# Patient Record
Sex: Female | Born: 1980 | Race: Black or African American | Hispanic: No | Marital: Single | Smoking: Never smoker
Health system: Southern US, Community
[De-identification: ages and names within clinical notes are randomized; demographics above are authoritative.]

## PROBLEM LIST (undated history)

## (undated) DIAGNOSIS — K219 Gastro-esophageal reflux disease without esophagitis: Secondary | ICD-10-CM

## (undated) DIAGNOSIS — K3184 Gastroparesis: Secondary | ICD-10-CM

## (undated) HISTORY — PX: FOOT SURGERY: SHX648

---

## 2009-11-29 ENCOUNTER — Emergency Department (HOSPITAL_BASED_OUTPATIENT_CLINIC_OR_DEPARTMENT_OTHER): Admission: EM | Admit: 2009-11-29 | Discharge: 2009-11-29 | Payer: Self-pay | Admitting: Emergency Medicine

## 2009-11-29 ENCOUNTER — Ambulatory Visit: Payer: Self-pay | Admitting: Diagnostic Radiology

## 2009-12-30 ENCOUNTER — Ambulatory Visit: Payer: Self-pay | Admitting: Diagnostic Radiology

## 2009-12-30 ENCOUNTER — Emergency Department (HOSPITAL_BASED_OUTPATIENT_CLINIC_OR_DEPARTMENT_OTHER): Admission: EM | Admit: 2009-12-30 | Discharge: 2009-12-30 | Payer: Self-pay | Admitting: Emergency Medicine

## 2010-03-26 ENCOUNTER — Emergency Department (HOSPITAL_BASED_OUTPATIENT_CLINIC_OR_DEPARTMENT_OTHER): Admission: EM | Admit: 2010-03-26 | Discharge: 2010-03-26 | Payer: Self-pay | Admitting: Emergency Medicine

## 2011-07-26 IMAGING — CR DG CHEST 2V
2 series · 2 of 2 positions shown · non-contrast
Comparison: None.

CLINICAL DATA: Cough.  Vomiting blood for 1 week.

CHEST - 2 VIEW

[w chest pa]
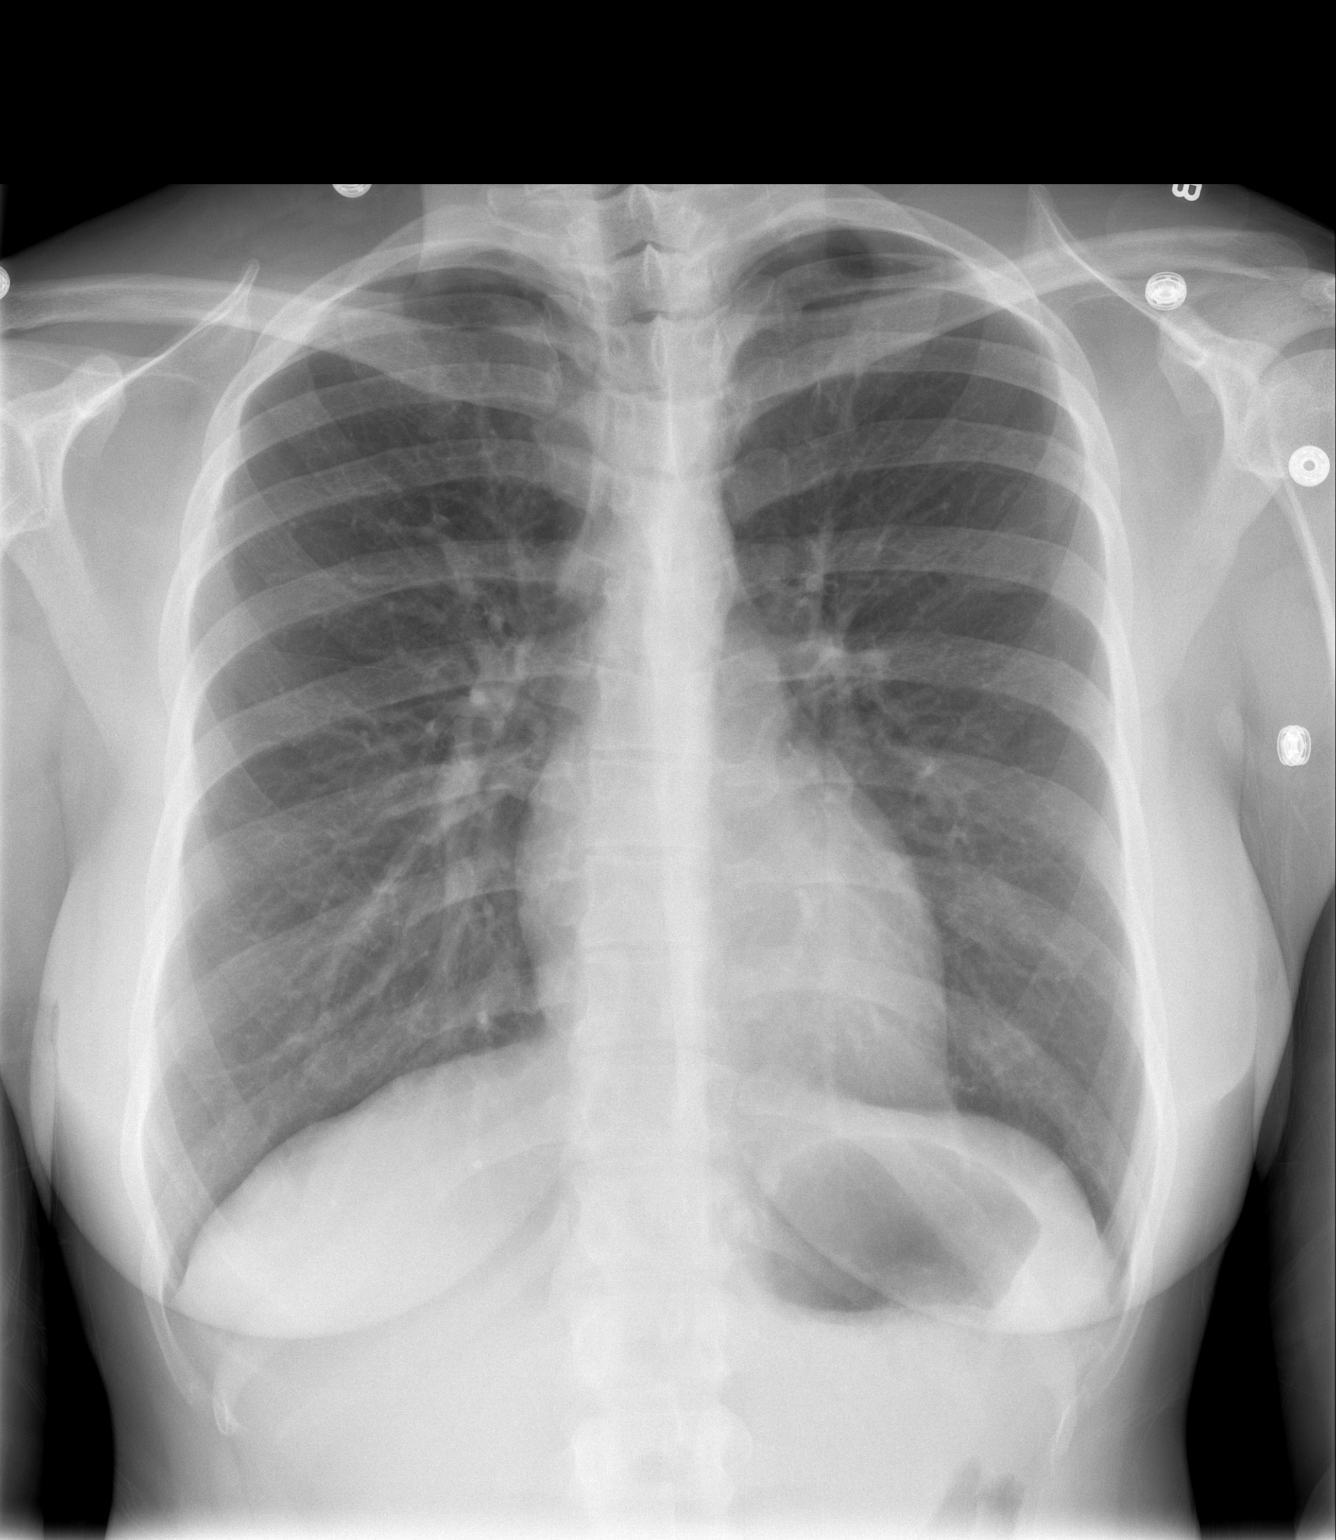

[w chest lat]
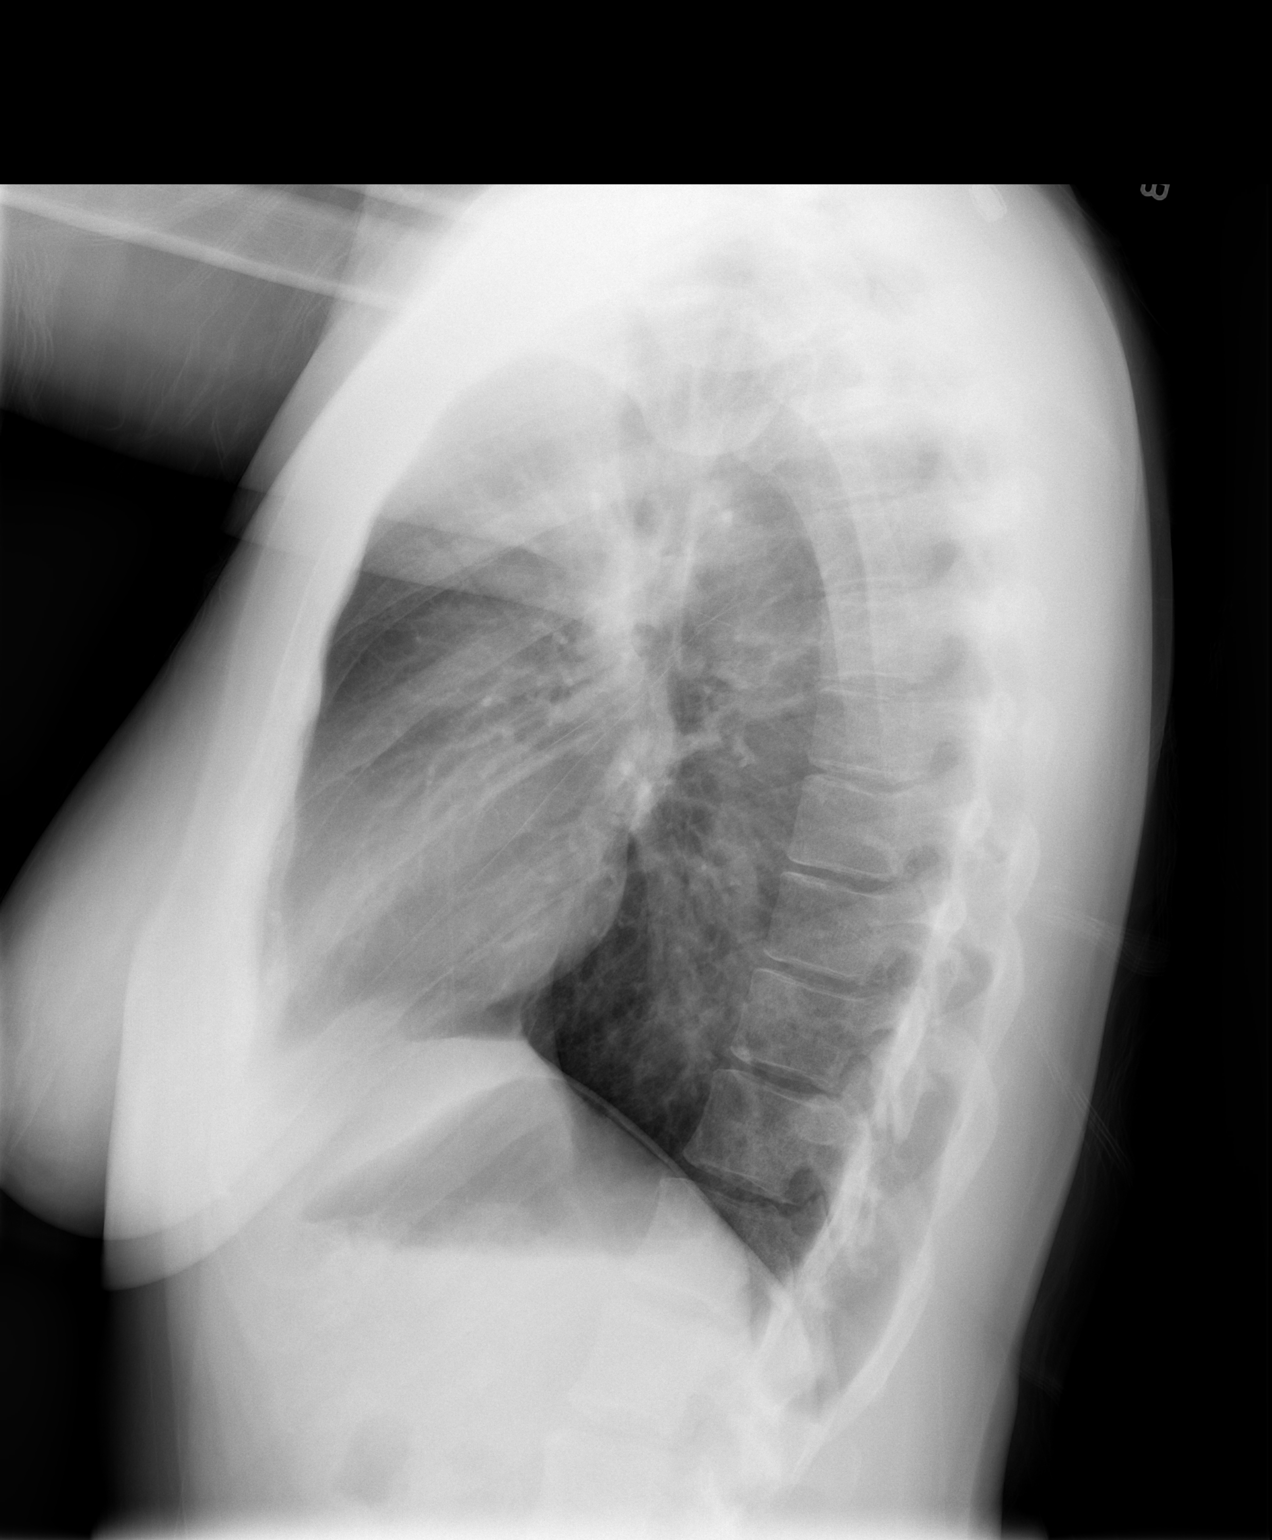

[2 of 2 positions shown; findings below may reference images not displayed]

FINDINGS: Small focus patchy airspace disease is present lateral to
the left heart border.  On the lateral view, this localizes to the
left lower lobe.  The appearance is consistent with a small focus
of pneumonia.  Right lung appears clear.  Cardiomediastinal
silhouette within normal limits.  Trachea midline.
IMPRESSION: Tiny focus of airspace disease in the left lower lobe consistent
with pneumonia.

## 2014-04-05 ENCOUNTER — Other Ambulatory Visit: Payer: Self-pay | Admitting: Obstetrics and Gynecology

## 2014-04-05 DIAGNOSIS — N6011 Diffuse cystic mastopathy of right breast: Secondary | ICD-10-CM

## 2014-04-05 DIAGNOSIS — N644 Mastodynia: Secondary | ICD-10-CM

## 2014-04-15 ENCOUNTER — Encounter (INDEPENDENT_AMBULATORY_CARE_PROVIDER_SITE_OTHER): Payer: Self-pay

## 2014-04-15 ENCOUNTER — Ambulatory Visit
Admission: RE | Admit: 2014-04-15 | Discharge: 2014-04-15 | Disposition: A | Discharge status: Home / Self Care | Payer: BC Managed Care – PPO | Source: Ambulatory Visit | Attending: Obstetrics and Gynecology | Admitting: Obstetrics and Gynecology

## 2014-04-15 DIAGNOSIS — N644 Mastodynia: Secondary | ICD-10-CM

## 2014-04-15 DIAGNOSIS — N6011 Diffuse cystic mastopathy of right breast: Secondary | ICD-10-CM

## 2017-04-01 DIAGNOSIS — Z01419 Encounter for gynecological examination (general) (routine) without abnormal findings: Secondary | ICD-10-CM | POA: Diagnosis not present

## 2017-04-01 DIAGNOSIS — Z124 Encounter for screening for malignant neoplasm of cervix: Secondary | ICD-10-CM | POA: Diagnosis not present

## 2017-04-11 DIAGNOSIS — L309 Dermatitis, unspecified: Secondary | ICD-10-CM | POA: Diagnosis not present

## 2017-04-21 DIAGNOSIS — M2142 Flat foot [pes planus] (acquired), left foot: Secondary | ICD-10-CM | POA: Diagnosis not present

## 2017-04-21 DIAGNOSIS — M25872 Other specified joint disorders, left ankle and foot: Secondary | ICD-10-CM | POA: Diagnosis not present

## 2017-07-06 DIAGNOSIS — G43909 Migraine, unspecified, not intractable, without status migrainosus: Secondary | ICD-10-CM | POA: Diagnosis not present

## 2017-08-02 DIAGNOSIS — M9903 Segmental and somatic dysfunction of lumbar region: Secondary | ICD-10-CM | POA: Diagnosis not present

## 2017-08-02 DIAGNOSIS — M9902 Segmental and somatic dysfunction of thoracic region: Secondary | ICD-10-CM | POA: Diagnosis not present

## 2017-08-02 DIAGNOSIS — M9905 Segmental and somatic dysfunction of pelvic region: Secondary | ICD-10-CM | POA: Diagnosis not present

## 2017-08-02 DIAGNOSIS — M791 Myalgia: Secondary | ICD-10-CM | POA: Diagnosis not present

## 2017-08-27 DIAGNOSIS — R079 Chest pain, unspecified: Secondary | ICD-10-CM | POA: Diagnosis not present

## 2017-08-27 DIAGNOSIS — G43009 Migraine without aura, not intractable, without status migrainosus: Secondary | ICD-10-CM | POA: Diagnosis not present

## 2017-08-27 DIAGNOSIS — R42 Dizziness and giddiness: Secondary | ICD-10-CM | POA: Diagnosis not present

## 2017-08-27 DIAGNOSIS — R112 Nausea with vomiting, unspecified: Secondary | ICD-10-CM | POA: Diagnosis not present

## 2017-08-27 DIAGNOSIS — R0789 Other chest pain: Secondary | ICD-10-CM | POA: Diagnosis not present

## 2017-08-27 DIAGNOSIS — R748 Abnormal levels of other serum enzymes: Secondary | ICD-10-CM | POA: Diagnosis not present

## 2017-08-28 DIAGNOSIS — R079 Chest pain, unspecified: Secondary | ICD-10-CM | POA: Diagnosis not present

## 2017-11-08 DIAGNOSIS — Z3042 Encounter for surveillance of injectable contraceptive: Secondary | ICD-10-CM | POA: Diagnosis not present

## 2017-11-08 DIAGNOSIS — N939 Abnormal uterine and vaginal bleeding, unspecified: Secondary | ICD-10-CM | POA: Diagnosis not present

## 2017-11-08 DIAGNOSIS — L659 Nonscarring hair loss, unspecified: Secondary | ICD-10-CM | POA: Diagnosis not present

## 2017-12-14 ENCOUNTER — Other Ambulatory Visit: Payer: Self-pay | Admitting: Obstetrics and Gynecology

## 2018-01-06 ENCOUNTER — Ambulatory Visit (HOSPITAL_COMMUNITY)
Admission: RE | Admit: 2018-01-06 | Payer: BLUE CROSS/BLUE SHIELD | Source: Ambulatory Visit | Admitting: Obstetrics and Gynecology

## 2018-01-06 ENCOUNTER — Encounter (HOSPITAL_COMMUNITY): Payer: Self-pay | Admitting: Anesthesiology

## 2018-01-06 ENCOUNTER — Encounter (HOSPITAL_COMMUNITY): Admission: RE | Payer: Self-pay | Source: Ambulatory Visit

## 2018-01-06 SURGERY — DILATATION AND CURETTAGE /HYSTEROSCOPY
Anesthesia: Choice

## 2018-01-06 MED ORDER — FENTANYL CITRATE (PF) 250 MCG/5ML IJ SOLN
INTRAMUSCULAR | Status: AC
Start: 2018-01-06 — End: ?
  Filled 2018-01-06: qty 5

## 2018-01-06 MED ORDER — MIDAZOLAM HCL 2 MG/2ML IJ SOLN
INTRAMUSCULAR | Status: AC
  Filled 2018-01-06: qty 2

## 2018-01-06 NOTE — OR Nursing (Signed)
Pt called this morning regarding surgery at 12. She answered and said that she was en route to the hospital.  Time was approx. 1045. Dr. Tenny Crawoss made aware of patients schedule.  Dr. Tenny Crawoss called the patient and canceled her case. Pt arrived about an hour late.

## 2018-01-06 NOTE — Progress Notes (Signed)
  Pt was scheduled for Hysteroscopy, dilation and curettage for endometrial polyp today at 12:00. The surgical case has been scheduled since 12/05/2017. The hospital has made multiple attempts to contact the patient in the last 2 weeks including 2 letters to her home for pre-admission testing. The patient failed to respond to any attempts at the hospital contacting her until this morning at 1045. The patient then arrived at 11:30 for her 12:00 case. Given the patient's lack of compliance with pre-admission testing and failure to arrive in adequate time for operative preparations, I have made the decision to cancel the patient's case. My office will contact the patient to make further arrangements.

## 2018-03-08 DIAGNOSIS — L2389 Allergic contact dermatitis due to other agents: Secondary | ICD-10-CM | POA: Diagnosis not present

## 2018-04-16 DIAGNOSIS — J039 Acute tonsillitis, unspecified: Secondary | ICD-10-CM | POA: Diagnosis not present

## 2018-09-07 DIAGNOSIS — F411 Generalized anxiety disorder: Secondary | ICD-10-CM | POA: Diagnosis not present

## 2018-09-07 DIAGNOSIS — F331 Major depressive disorder, recurrent, moderate: Secondary | ICD-10-CM | POA: Diagnosis not present

## 2018-10-24 DIAGNOSIS — F331 Major depressive disorder, recurrent, moderate: Secondary | ICD-10-CM | POA: Diagnosis not present

## 2018-10-24 DIAGNOSIS — F411 Generalized anxiety disorder: Secondary | ICD-10-CM | POA: Diagnosis not present

## 2018-11-01 DIAGNOSIS — F411 Generalized anxiety disorder: Secondary | ICD-10-CM | POA: Diagnosis not present

## 2018-11-28 DIAGNOSIS — L309 Dermatitis, unspecified: Secondary | ICD-10-CM | POA: Diagnosis not present

## 2018-11-28 DIAGNOSIS — K649 Unspecified hemorrhoids: Secondary | ICD-10-CM | POA: Diagnosis not present

## 2018-11-28 DIAGNOSIS — Z760 Encounter for issue of repeat prescription: Secondary | ICD-10-CM | POA: Diagnosis not present

## 2018-12-13 DIAGNOSIS — F411 Generalized anxiety disorder: Secondary | ICD-10-CM | POA: Diagnosis not present

## 2018-12-28 DIAGNOSIS — Z79899 Other long term (current) drug therapy: Secondary | ICD-10-CM | POA: Diagnosis not present

## 2018-12-28 DIAGNOSIS — H04123 Dry eye syndrome of bilateral lacrimal glands: Secondary | ICD-10-CM | POA: Diagnosis not present

## 2018-12-28 DIAGNOSIS — H15011 Anterior scleritis, right eye: Secondary | ICD-10-CM | POA: Diagnosis not present

## 2019-01-02 DIAGNOSIS — F411 Generalized anxiety disorder: Secondary | ICD-10-CM | POA: Diagnosis not present

## 2019-01-16 DIAGNOSIS — Z13220 Encounter for screening for lipoid disorders: Secondary | ICD-10-CM | POA: Diagnosis not present

## 2019-01-16 DIAGNOSIS — Z1329 Encounter for screening for other suspected endocrine disorder: Secondary | ICD-10-CM | POA: Diagnosis not present

## 2019-01-16 DIAGNOSIS — Z130 Encounter for screening for diseases of the blood and blood-forming organs and certain disorders involving the immune mechanism: Secondary | ICD-10-CM | POA: Diagnosis not present

## 2019-01-16 DIAGNOSIS — Z01419 Encounter for gynecological examination (general) (routine) without abnormal findings: Secondary | ICD-10-CM | POA: Diagnosis not present

## 2019-01-16 DIAGNOSIS — Z6826 Body mass index (BMI) 26.0-26.9, adult: Secondary | ICD-10-CM | POA: Diagnosis not present

## 2019-01-16 DIAGNOSIS — Z0000 Encounter for general adult medical examination without abnormal findings: Secondary | ICD-10-CM | POA: Diagnosis not present

## 2019-01-16 DIAGNOSIS — N760 Acute vaginitis: Secondary | ICD-10-CM | POA: Diagnosis not present

## 2019-01-16 DIAGNOSIS — Z131 Encounter for screening for diabetes mellitus: Secondary | ICD-10-CM | POA: Diagnosis not present

## 2019-01-16 DIAGNOSIS — Z1151 Encounter for screening for human papillomavirus (HPV): Secondary | ICD-10-CM | POA: Diagnosis not present

## 2019-01-16 DIAGNOSIS — R350 Frequency of micturition: Secondary | ICD-10-CM | POA: Diagnosis not present

## 2019-01-18 DIAGNOSIS — F330 Major depressive disorder, recurrent, mild: Secondary | ICD-10-CM | POA: Diagnosis not present

## 2019-01-18 DIAGNOSIS — F411 Generalized anxiety disorder: Secondary | ICD-10-CM | POA: Diagnosis not present

## 2019-01-25 DIAGNOSIS — F331 Major depressive disorder, recurrent, moderate: Secondary | ICD-10-CM | POA: Diagnosis not present

## 2019-01-25 DIAGNOSIS — F411 Generalized anxiety disorder: Secondary | ICD-10-CM | POA: Diagnosis not present

## 2019-02-07 DIAGNOSIS — F411 Generalized anxiety disorder: Secondary | ICD-10-CM | POA: Diagnosis not present

## 2019-02-22 DIAGNOSIS — F411 Generalized anxiety disorder: Secondary | ICD-10-CM | POA: Diagnosis not present

## 2019-02-23 DIAGNOSIS — N920 Excessive and frequent menstruation with regular cycle: Secondary | ICD-10-CM | POA: Diagnosis not present

## 2019-03-12 DIAGNOSIS — R8271 Bacteriuria: Secondary | ICD-10-CM | POA: Diagnosis not present

## 2019-03-12 DIAGNOSIS — R350 Frequency of micturition: Secondary | ICD-10-CM | POA: Diagnosis not present

## 2019-03-12 DIAGNOSIS — R351 Nocturia: Secondary | ICD-10-CM | POA: Diagnosis not present

## 2019-03-22 DIAGNOSIS — F411 Generalized anxiety disorder: Secondary | ICD-10-CM | POA: Diagnosis not present

## 2019-04-04 DIAGNOSIS — A6000 Herpesviral infection of urogenital system, unspecified: Secondary | ICD-10-CM | POA: Diagnosis not present

## 2019-04-04 DIAGNOSIS — N9089 Other specified noninflammatory disorders of vulva and perineum: Secondary | ICD-10-CM | POA: Diagnosis not present

## 2019-04-09 DIAGNOSIS — F411 Generalized anxiety disorder: Secondary | ICD-10-CM | POA: Diagnosis not present

## 2019-04-24 DIAGNOSIS — F411 Generalized anxiety disorder: Secondary | ICD-10-CM | POA: Diagnosis not present

## 2019-04-24 DIAGNOSIS — F331 Major depressive disorder, recurrent, moderate: Secondary | ICD-10-CM | POA: Diagnosis not present

## 2019-09-18 DIAGNOSIS — Z3202 Encounter for pregnancy test, result negative: Secondary | ICD-10-CM | POA: Diagnosis not present

## 2019-09-18 DIAGNOSIS — N920 Excessive and frequent menstruation with regular cycle: Secondary | ICD-10-CM | POA: Diagnosis not present

## 2019-10-23 DIAGNOSIS — N760 Acute vaginitis: Secondary | ICD-10-CM | POA: Diagnosis not present

## 2020-01-31 DIAGNOSIS — F331 Major depressive disorder, recurrent, moderate: Secondary | ICD-10-CM | POA: Diagnosis not present

## 2020-03-05 DIAGNOSIS — F331 Major depressive disorder, recurrent, moderate: Secondary | ICD-10-CM | POA: Diagnosis not present

## 2020-03-05 DIAGNOSIS — F411 Generalized anxiety disorder: Secondary | ICD-10-CM | POA: Diagnosis not present

## 2020-03-28 DIAGNOSIS — Z130 Encounter for screening for diseases of the blood and blood-forming organs and certain disorders involving the immune mechanism: Secondary | ICD-10-CM | POA: Diagnosis not present

## 2020-03-28 DIAGNOSIS — Z01419 Encounter for gynecological examination (general) (routine) without abnormal findings: Secondary | ICD-10-CM | POA: Diagnosis not present

## 2020-03-28 DIAGNOSIS — L80 Vitiligo: Secondary | ICD-10-CM | POA: Diagnosis not present

## 2020-03-28 DIAGNOSIS — Z13220 Encounter for screening for lipoid disorders: Secondary | ICD-10-CM | POA: Diagnosis not present

## 2020-03-28 DIAGNOSIS — Z0000 Encounter for general adult medical examination without abnormal findings: Secondary | ICD-10-CM | POA: Diagnosis not present

## 2020-03-28 DIAGNOSIS — Z131 Encounter for screening for diabetes mellitus: Secondary | ICD-10-CM | POA: Diagnosis not present

## 2020-03-28 DIAGNOSIS — Z6825 Body mass index (BMI) 25.0-25.9, adult: Secondary | ICD-10-CM | POA: Diagnosis not present

## 2020-03-28 DIAGNOSIS — Z1151 Encounter for screening for human papillomavirus (HPV): Secondary | ICD-10-CM | POA: Diagnosis not present

## 2020-04-24 DIAGNOSIS — Z20822 Contact with and (suspected) exposure to covid-19: Secondary | ICD-10-CM | POA: Diagnosis not present

## 2020-05-01 ENCOUNTER — Other Ambulatory Visit: Payer: Self-pay | Admitting: Gastroenterology

## 2020-05-01 ENCOUNTER — Other Ambulatory Visit (HOSPITAL_COMMUNITY): Payer: Self-pay | Admitting: Gastroenterology

## 2020-05-01 DIAGNOSIS — R1011 Right upper quadrant pain: Secondary | ICD-10-CM

## 2020-05-01 DIAGNOSIS — K219 Gastro-esophageal reflux disease without esophagitis: Secondary | ICD-10-CM | POA: Diagnosis not present

## 2020-05-01 DIAGNOSIS — R112 Nausea with vomiting, unspecified: Secondary | ICD-10-CM | POA: Diagnosis not present

## 2020-05-01 DIAGNOSIS — K5904 Chronic idiopathic constipation: Secondary | ICD-10-CM | POA: Diagnosis not present

## 2020-05-01 DIAGNOSIS — R0789 Other chest pain: Secondary | ICD-10-CM | POA: Diagnosis not present

## 2020-05-14 DIAGNOSIS — F331 Major depressive disorder, recurrent, moderate: Secondary | ICD-10-CM | POA: Diagnosis not present

## 2020-05-14 DIAGNOSIS — F411 Generalized anxiety disorder: Secondary | ICD-10-CM | POA: Diagnosis not present

## 2020-05-15 ENCOUNTER — Encounter (HOSPITAL_COMMUNITY): Payer: Self-pay

## 2020-05-15 ENCOUNTER — Encounter (HOSPITAL_COMMUNITY): Payer: BC Managed Care – PPO

## 2020-05-15 ENCOUNTER — Encounter (HOSPITAL_COMMUNITY): Payer: BC Managed Care – PPO | Attending: Gastroenterology

## 2020-05-20 DIAGNOSIS — F331 Major depressive disorder, recurrent, moderate: Secondary | ICD-10-CM | POA: Diagnosis not present

## 2020-05-20 DIAGNOSIS — F411 Generalized anxiety disorder: Secondary | ICD-10-CM | POA: Diagnosis not present

## 2020-08-15 ENCOUNTER — Emergency Department (HOSPITAL_BASED_OUTPATIENT_CLINIC_OR_DEPARTMENT_OTHER)
Admission: EM | Admit: 2020-08-15 | Discharge: 2020-08-16 | Discharge status: Against Medical Advice | Payer: Managed Care, Other (non HMO) | Attending: Emergency Medicine | Admitting: Emergency Medicine

## 2020-08-15 ENCOUNTER — Other Ambulatory Visit: Payer: Self-pay

## 2020-08-15 ENCOUNTER — Encounter (HOSPITAL_BASED_OUTPATIENT_CLINIC_OR_DEPARTMENT_OTHER): Payer: Self-pay | Admitting: *Deleted

## 2020-08-15 ENCOUNTER — Emergency Department (HOSPITAL_BASED_OUTPATIENT_CLINIC_OR_DEPARTMENT_OTHER): Payer: Managed Care, Other (non HMO)

## 2020-08-15 DIAGNOSIS — Z79899 Other long term (current) drug therapy: Secondary | ICD-10-CM | POA: Insufficient documentation

## 2020-08-15 DIAGNOSIS — R072 Precordial pain: Secondary | ICD-10-CM | POA: Insufficient documentation

## 2020-08-15 NOTE — ED Notes (Signed)
ED Provider at bedside. 

## 2020-08-15 NOTE — ED Triage Notes (Signed)
Endoscopy 2 weeks ago. states a week later she was having sternal pain. She was seen at a clinic this am and was told her breath sounds were decreased. sats 100%.

## 2020-08-16 ENCOUNTER — Emergency Department (HOSPITAL_BASED_OUTPATIENT_CLINIC_OR_DEPARTMENT_OTHER): Payer: Managed Care, Other (non HMO)

## 2020-08-16 ENCOUNTER — Encounter (HOSPITAL_BASED_OUTPATIENT_CLINIC_OR_DEPARTMENT_OTHER): Payer: Self-pay

## 2020-08-16 LAB — CBC WITH DIFFERENTIAL/PLATELET
Abs Immature Granulocytes: 0.02 10*3/uL (ref 0.00–0.07)
Basophils Absolute: 0 10*3/uL (ref 0.0–0.1)
Basophils Relative: 1 %
Eosinophils Absolute: 0.4 10*3/uL (ref 0.0–0.5)
Eosinophils Relative: 4 %
HCT: 40.9 % (ref 36.0–46.0)
Hemoglobin: 13.7 g/dL (ref 12.0–15.0)
Immature Granulocytes: 0 %
Lymphocytes Relative: 33 %
Lymphs Abs: 2.7 10*3/uL (ref 0.7–4.0)
MCH: 30.5 pg (ref 26.0–34.0)
MCHC: 33.5 g/dL (ref 30.0–36.0)
MCV: 91.1 fL (ref 80.0–100.0)
Monocytes Absolute: 0.6 10*3/uL (ref 0.1–1.0)
Monocytes Relative: 7 %
Neutro Abs: 4.4 10*3/uL (ref 1.7–7.7)
Neutrophils Relative %: 55 %
Platelets: 249 10*3/uL (ref 150–400)
RBC: 4.49 MIL/uL (ref 3.87–5.11)
RDW: 13.7 % (ref 11.5–15.5)
WBC: 8 10*3/uL (ref 4.0–10.5)
nRBC: 0 % (ref 0.0–0.2)

## 2020-08-16 LAB — BASIC METABOLIC PANEL
Anion gap: 10 (ref 5–15)
BUN: 17 mg/dL (ref 6–20)
CO2: 25 mmol/L (ref 22–32)
Calcium: 9.2 mg/dL (ref 8.9–10.3)
Chloride: 102 mmol/L (ref 98–111)
Creatinine, Ser: 0.92 mg/dL (ref 0.44–1.00)
GFR calc Af Amer: >60 mL/min (ref >60)
GFR calc non Af Amer: >60 mL/min (ref >60)
Glucose, Bld: 90 mg/dL (ref 70–99)
Potassium: 3.5 mmol/L (ref 3.5–5.1)
Sodium: 137 mmol/L (ref 135–145)

## 2020-08-16 LAB — TROPONIN I (HIGH SENSITIVITY): Troponin I (High Sensitivity): <2 ng/L (ref <18)

## 2020-08-16 LAB — PREGNANCY, URINE: Preg Test, Ur: NEGATIVE

## 2020-08-16 MED ORDER — IOHEXOL 350 MG/ML SOLN
100.0000 mL | Freq: Once | INTRAVENOUS | Status: AC | PRN
  Administered 2020-08-16: 100 mL via INTRAVENOUS

## 2020-08-16 NOTE — ED Provider Notes (Signed)
[facility] EMERGENCY DEPARTMENT Provider Note   CSN: [id number] Arrival date & time: 08/15/20  1759     History Chief Complaint  Patient presents with  . Chest Pain    Tiea Lantis is a 39 y.o. female.  The history is provided by the patient.  Chest Pain Pain location:  Substernal area Pain quality: dull   Pain radiates to:  Does not radiate Pain severity:  Moderate Onset quality:  Gradual Duration:  2 weeks Timing:  Constant Progression:  Unchanged Chronicity:  New Context: breathing   Relieved by:  Nothing Worsened by:  Nothing Ineffective treatments:  None tried Associated symptoms: no abdominal pain, no altered mental status, no anxiety, no cough, no fatigue, no fever, no lower extremity edema, no palpitations, no PND, no shortness of breath, no vomiting and no weakness   Risk factors: no aortic disease   Patient with CP for 2 weeks s/p endoscopy.  No leg pain.  No f/c/r.       History reviewed. No pertinent past medical history.  There are no problems to display for this patient.   Past Surgical History:  Procedure Laterality Date  . FOOT SURGERY       OB History   No obstetric history on file.     History reviewed. No pertinent family history.  Social History   Tobacco Use  . Smoking status: Never Smoker  . Smokeless tobacco: Never Used  Substance Use Topics  . Alcohol use: Not Currently  . Drug use: Never    Home Medications Prior to Admission medications   Medication Sig Start Date End Date Taking? Authorizing Provider  Multiple Vitamin (MULTIVITAMIN WITH MINERALS) TABS tablet Take 1 tablet by mouth daily.   Yes [provider]  topiramate (TOPAMAX) 50 MG tablet Take 50 mg by mouth 2 (two) times daily as needed (migraine).   Yes [provider]  ibuprofen (ADVIL,MOTRIN) 600 MG tablet Take 600 mg by mouth 2 (two) times daily as needed for headache or moderate pain.    [provider]    MedroxyPROGESTERone Acetate 150 MG/ML SUSY Inject 150 mg as directed every 3 (three) months. 11/08/17   [provider]    Allergies    Codeine, Penicillins, and Shrimp [shellfish allergy]  Review of Systems   Review of Systems  Constitutional: Negative for fatigue and fever.  HENT: Negative for congestion.   Eyes: Negative for visual disturbance.  Respiratory: Negative for cough and shortness of breath.   Cardiovascular: Positive for chest pain. Negative for palpitations, leg swelling and PND.  Gastrointestinal: Negative for abdominal pain and vomiting.  Genitourinary: Negative for difficulty urinating.  Skin: Negative for rash.  Neurological: Negative for weakness.  Psychiatric/Behavioral: Negative for agitation.  All other systems reviewed and are negative.   Physical Exam Updated Vital Signs BP 128/88 (BP Location: Right Arm)   Pulse 69   Temp 99.3 F (37.4 C) (Oral)   Resp 16   Ht 5\' 9"  (1.753 m)   Wt 75.8 kg   SpO2 100%   BMI 24.66 kg/m   Physical Exam Vitals and nursing note reviewed.  Constitutional:      Appearance: Normal appearance.  HENT:     Head: Normocephalic and atraumatic.     Nose: Nose normal.  Eyes:     Conjunctiva/sclera: Conjunctivae normal.     Pupils: Pupils are equal, round, and reactive to light.  Cardiovascular:     Rate and Rhythm: Normal rate and regular rhythm.  Pulses: Normal pulses.     Heart sounds: Normal heart sounds.  Pulmonary:     Effort: Pulmonary effort is normal.     Breath sounds: Normal breath sounds.  Abdominal:     General: Abdomen is flat. Bowel sounds are normal.     Palpations: Abdomen is soft.     Tenderness: There is no abdominal tenderness. There is no guarding or rebound.  Musculoskeletal:        General: Normal range of motion.     Cervical back: Normal range of motion and neck supple.  Skin:    General: Skin is warm and dry.     Capillary Refill: Capillary refill takes less than 2 seconds.   Neurological:     General: No focal deficit present.     Mental Status: She is alert and oriented to person, place, and time.     Deep Tendon Reflexes: Reflexes normal.  Psychiatric:        Mood and Affect: Mood normal.        Behavior: Behavior normal.     ED Results / Procedures / Treatments   Labs (all labs ordered are listed, but only abnormal results are displayed) Results for orders placed or performed during the hospital encounter of 08/15/20  CBC with Differential/Platelet  Result Value Ref Range   WBC 8.0 4.0 - 10.5 K/uL   RBC 4.49 3.87 - 5.11 MIL/uL   Hemoglobin 13.7 12.0 - 15.0 g/dL   HCT 50.2 36 - 46 %   MCV 91.1 80.0 - 100.0 fL   MCH 30.5 26.0 - 34.0 pg   MCHC 33.5 30.0 - 36.0 g/dL   RDW 77.4 12.8 - 78.6 %   Platelets 249 150 - 400 K/uL   nRBC 0.0 0.0 - 0.2 %   Neutrophils Relative % 55 %   Neutro Abs 4.4 1.7 - 7.7 K/uL   Lymphocytes Relative 33 %   Lymphs Abs 2.7 0.7 - 4.0 K/uL   Monocytes Relative 7 %   Monocytes Absolute 0.6 0 - 1 K/uL   Eosinophils Relative 4 %   Eosinophils Absolute 0.4 0 - 0 K/uL   Basophils Relative 1 %   Basophils Absolute 0.0 0 - 0 K/uL   Immature Granulocytes 0 %   Abs Immature Granulocytes 0.02 0.00 - 0.07 K/uL  Basic metabolic panel  Result Value Ref Range   Sodium 137 135 - 145 mmol/L   Potassium 3.5 3.5 - 5.1 mmol/L   Chloride 102 98 - 111 mmol/L   CO2 25 22 - 32 mmol/L   Glucose, Bld 90 70 - 99 mg/dL   BUN 17 6 - 20 mg/dL   Creatinine, Ser 7.67 0.44 - 1.00 mg/dL   Calcium 9.2 8.9 - 20.9 mg/dL   GFR calc non Af Amer >60 >60 mL/min   GFR calc Af Amer >60 >60 mL/min   Anion gap 10 5 - 15  Pregnancy, urine  Result Value Ref Range   Preg Test, Ur NEGATIVE NEGATIVE  Troponin I (High Sensitivity)  Result Value Ref Range   Troponin I (High Sensitivity) <2 <18 ng/L   DG Chest 2 View  Result Date: 08/15/2020 CLINICAL DATA:  Shortness of breath, chest pain EXAM: CHEST - 2 VIEW COMPARISON:  Radiograph 11/29/2009 FINDINGS:  No consolidation, features of edema, pneumothorax, or effusion. Pulmonary vascularity is normally distributed. The cardiomediastinal contours are unremarkable. No acute osseous or soft tissue abnormality. IMPRESSION: No acute cardiopulmonary abnormality. Electronically Signed   By: Samuella Cota  [facility] M.D.   On: 08/15/2020 19:18    EKG EKG Interpretation  Date/Time:  Friday August 15 2020 18:15:13 EDT Ventricular Rate:  81 PR Interval:  144 QRS Duration: 60 QT Interval:  380 QTC Calculation: 441 R Axis:   76 Text Interpretation: Normal sinus rhythm Possible Left atrial enlargement Borderline ECG No old tracing to compare Confirmed by Susy Frizzle [phone]) on 08/15/2020 6:20:41 PM   Radiology DG Chest 2 View  Result Date: 08/15/2020 CLINICAL DATA:  Shortness of breath, chest pain EXAM: CHEST - 2 VIEW COMPARISON:  Radiograph 11/29/2009 FINDINGS: No consolidation, features of edema, pneumothorax, or effusion. Pulmonary vascularity is normally distributed. The cardiomediastinal contours are unremarkable. No acute osseous or soft tissue abnormality. IMPRESSION: No acute cardiopulmonary abnormality. Electronically Signed   By: Kreg Shropshire M.D.   On: 08/15/2020 19:18    Procedures Procedures (including critical care time)  Medications Ordered in ED Medications  iohexol (OMNIPAQUE) 350 MG/ML injection 100 mL (100 mLs Intravenous Contrast Given 08/16/20 0037)    ED Course  I have reviewed the triage vital signs and the nursing notes.  Pertinent labs & imaging results that were available during my care of the patient were reviewed by me and considered in my medical decision making (see chart for details).   Ruled out for MI in the ED, heart score is 1 very low risk for mace.  Informed by nurse patient left AMA from ED while in the process of PE work up.      Elesia Pinera was evaluated in Emergency Department on 08/16/2020 for the symptoms described in the history of present illness. She  was evaluated in the context of the global COVID-19 pandemic, which necessitated consideration that the patient might be at risk for infection with the SARS-CoV-2 virus that causes COVID-19. Institutional protocols and algorithms that pertain to the evaluation of patients at risk for COVID-19 are in a state of rapid change based on information released by regulatory bodies including the CDC and federal and state organizations. These policies and algorithms were followed during the patient's care in the ED.  Final Clinical Impression(s) / ED Diagnoses Final diagnoses:  Precordial pain   Left AMA before I could speak to the patient    Arden Axon, MD 08/16/20 9937

## 2020-08-16 NOTE — ED Notes (Addendum)
Pt expressed desire to leave, she states she has been tolerating pain x1week and has worked a lot and this is her first day off in many days and wants to leave. This RN explained delay and plan of care, pt states she understand the risk and still wants to leave. Pt agrees to complete scan and then leave. MD notified.

## 2020-12-02 DIAGNOSIS — F418 Other specified anxiety disorders: Secondary | ICD-10-CM | POA: Insufficient documentation

## 2021-01-01 ENCOUNTER — Other Ambulatory Visit: Payer: Self-pay | Admitting: Obstetrics and Gynecology

## 2021-01-01 DIAGNOSIS — Z1231 Encounter for screening mammogram for malignant neoplasm of breast: Secondary | ICD-10-CM

## 2021-02-17 ENCOUNTER — Other Ambulatory Visit (HOSPITAL_COMMUNITY): Payer: Self-pay | Admitting: Gastroenterology

## 2021-02-17 ENCOUNTER — Other Ambulatory Visit: Payer: Self-pay | Admitting: Gastroenterology

## 2021-02-17 DIAGNOSIS — R1011 Right upper quadrant pain: Secondary | ICD-10-CM

## 2021-02-18 ENCOUNTER — Ambulatory Visit
Admission: RE | Admit: 2021-02-18 | Discharge: 2021-02-18 | Disposition: A | Discharge status: Home / Self Care | Payer: Managed Care, Other (non HMO) | Source: Ambulatory Visit | Attending: Obstetrics and Gynecology | Admitting: Obstetrics and Gynecology

## 2021-02-18 ENCOUNTER — Other Ambulatory Visit: Payer: Self-pay

## 2021-02-18 DIAGNOSIS — Z1231 Encounter for screening mammogram for malignant neoplasm of breast: Secondary | ICD-10-CM

## 2021-03-05 ENCOUNTER — Ambulatory Visit (HOSPITAL_COMMUNITY)
Admission: RE | Admit: 2021-03-05 | Discharge: 2021-03-05 | Disposition: A | Discharge status: Home / Self Care | Payer: Managed Care, Other (non HMO) | Source: Ambulatory Visit | Attending: Gastroenterology | Admitting: Gastroenterology

## 2021-03-05 ENCOUNTER — Encounter (HOSPITAL_COMMUNITY)
Admission: RE | Admit: 2021-03-05 | Discharge: 2021-03-05 | Disposition: A | Discharge status: Home / Self Care | Payer: Managed Care, Other (non HMO) | Source: Ambulatory Visit | Attending: Gastroenterology | Admitting: Gastroenterology

## 2021-03-05 ENCOUNTER — Other Ambulatory Visit: Payer: Self-pay

## 2021-03-05 DIAGNOSIS — R1011 Right upper quadrant pain: Secondary | ICD-10-CM

## 2021-03-05 MED ORDER — TECHNETIUM TC 99M MEBROFENIN IV KIT
5.1000 | PACK | Freq: Once | INTRAVENOUS | Status: AC | PRN
  Administered 2021-03-05: 5.1 via INTRAVENOUS

## 2021-03-23 ENCOUNTER — Other Ambulatory Visit: Payer: Self-pay

## 2021-03-23 ENCOUNTER — Other Ambulatory Visit (HOSPITAL_BASED_OUTPATIENT_CLINIC_OR_DEPARTMENT_OTHER): Payer: Self-pay

## 2021-03-23 ENCOUNTER — Emergency Department (HOSPITAL_BASED_OUTPATIENT_CLINIC_OR_DEPARTMENT_OTHER): Payer: Managed Care, Other (non HMO)

## 2021-03-23 ENCOUNTER — Encounter (HOSPITAL_BASED_OUTPATIENT_CLINIC_OR_DEPARTMENT_OTHER): Payer: Self-pay | Admitting: Emergency Medicine

## 2021-03-23 ENCOUNTER — Emergency Department (HOSPITAL_BASED_OUTPATIENT_CLINIC_OR_DEPARTMENT_OTHER)
Admission: EM | Admit: 2021-03-23 | Discharge: 2021-03-23 | Disposition: A | Discharge status: Home / Self Care | Payer: Managed Care, Other (non HMO) | Attending: Emergency Medicine | Admitting: Emergency Medicine

## 2021-03-23 DIAGNOSIS — R1033 Periumbilical pain: Secondary | ICD-10-CM | POA: Diagnosis not present

## 2021-03-23 DIAGNOSIS — R1013 Epigastric pain: Secondary | ICD-10-CM | POA: Insufficient documentation

## 2021-03-23 DIAGNOSIS — R1012 Left upper quadrant pain: Secondary | ICD-10-CM | POA: Diagnosis not present

## 2021-03-23 DIAGNOSIS — R079 Chest pain, unspecified: Secondary | ICD-10-CM | POA: Diagnosis present

## 2021-03-23 DIAGNOSIS — R6883 Chills (without fever): Secondary | ICD-10-CM | POA: Insufficient documentation

## 2021-03-23 DIAGNOSIS — R1011 Right upper quadrant pain: Secondary | ICD-10-CM | POA: Insufficient documentation

## 2021-03-23 HISTORY — DX: Gastro-esophageal reflux disease without esophagitis: K21.9

## 2021-03-23 LAB — LIPASE, BLOOD: Lipase: 36 U/L (ref 11–51)

## 2021-03-23 LAB — CBC WITH DIFFERENTIAL/PLATELET
Abs Immature Granulocytes: 0 10*3/uL (ref 0.00–0.07)
Basophils Absolute: 0 10*3/uL (ref 0.0–0.1)
Basophils Relative: 1 %
Eosinophils Absolute: 0.4 10*3/uL (ref 0.0–0.5)
Eosinophils Relative: 8 %
HCT: 38.9 % (ref 36.0–46.0)
Hemoglobin: 13.2 g/dL (ref 12.0–15.0)
Immature Granulocytes: 0 %
Lymphocytes Relative: 38 %
Lymphs Abs: 1.9 10*3/uL (ref 0.7–4.0)
MCH: 31 pg (ref 26.0–34.0)
MCHC: 33.9 g/dL (ref 30.0–36.0)
MCV: 91.3 fL (ref 80.0–100.0)
Monocytes Absolute: 0.4 10*3/uL (ref 0.1–1.0)
Monocytes Relative: 8 %
Neutro Abs: 2.2 10*3/uL (ref 1.7–7.7)
Neutrophils Relative %: 45 %
Platelets: 235 10*3/uL (ref 150–400)
RBC: 4.26 MIL/uL (ref 3.87–5.11)
RDW: 13.1 % (ref 11.5–15.5)
WBC: 5 10*3/uL (ref 4.0–10.5)
nRBC: 0 % (ref 0.0–0.2)

## 2021-03-23 LAB — COMPREHENSIVE METABOLIC PANEL
ALT: 12 U/L (ref 0–44)
AST: 14 U/L — ABNORMAL LOW (ref 15–41)
Albumin: 3.8 g/dL (ref 3.5–5.0)
Alkaline Phosphatase: 34 U/L — ABNORMAL LOW (ref 38–126)
Anion gap: 9 (ref 5–15)
BUN: 18 mg/dL (ref 6–20)
CO2: 24 mmol/L (ref 22–32)
Calcium: 8.9 mg/dL (ref 8.9–10.3)
Chloride: 105 mmol/L (ref 98–111)
Creatinine, Ser: 0.93 mg/dL (ref 0.44–1.00)
GFR, Estimated: >60 mL/min (ref >60)
Glucose, Bld: 86 mg/dL (ref 70–99)
Potassium: 3.8 mmol/L (ref 3.5–5.1)
Sodium: 138 mmol/L (ref 135–145)
Total Bilirubin: 0.4 mg/dL (ref 0.3–1.2)
Total Protein: 7.2 g/dL (ref 6.5–8.1)

## 2021-03-23 LAB — TROPONIN I (HIGH SENSITIVITY): Troponin I (High Sensitivity): <2 ng/L (ref <18)

## 2021-03-23 MED ORDER — ALUM & MAG HYDROXIDE-SIMETH 200-200-20 MG/5ML PO SUSP
30.0000 mL | Freq: Once | ORAL | Status: AC
  Administered 2021-03-23: 30 mL via ORAL
  Filled 2021-03-23: qty 30

## 2021-03-23 MED ORDER — SODIUM CHLORIDE 0.9 % IV BOLUS
1000.0000 mL | Freq: Once | INTRAVENOUS | Status: AC
  Administered 2021-03-23: 1000 mL via INTRAVENOUS

## 2021-03-23 MED ORDER — SUCRALFATE 1 G PO TABS
1.0000 g | ORAL_TABLET | Freq: Three times a day (TID) | ORAL | 0 refills | Status: DC
  Filled 2021-03-23: qty 60, 15d supply, fill #0

## 2021-03-23 MED ORDER — PROMETHAZINE HCL 25 MG RE SUPP
25.0000 mg | Freq: Four times a day (QID) | RECTAL | 0 refills | Status: DC | PRN
  Filled 2021-03-23: qty 12, 3d supply, fill #0

## 2021-03-23 NOTE — ED Provider Notes (Signed)
[facility] EMERGENCY DEPARTMENT Provider Note   CSN: [id number] Arrival date & time: 03/23/21  0160     History Chief Complaint  Patient presents with  . Chest Pain  . Shortness of Breath    Kelly Finley is a 40 y.o. female with past medical history significant for GERD followed by her gastroenterologist Dr. Loreta Ave who presents the ED with complaints of chest pain.  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  It is disturbing her day-to-day life.  She has been experiencing the symptoms for many months, but states that her current chest discomfort is worse.  She states that it began yesterday while driving in her vehicle and was associated with nausea and vomiting.  She also had lightheadedness upon standing and felt as though she might pass out.  She is endorsing chills.  She states that she takes Phenergan and Zofran at home without any relief.  I reviewed patient's medical record and HIDA scan obtained 03/05/2021 revealed normal biliary function.  She states that she had an upper endoscopy approximately 6 months ago that was largely unremarkable.  She reports that she has an appointment scheduled with general surgery for 04/17/2021, but states that her symptoms are getting progressively worse each month and is afraid that she will not be able to continue with her work until these issues are resolved.  She takes proton pump inhibitors, but has not tried Carafate.  She states that she had multiple episodes of emesis, some of them were red.  She is unclear as to whether or not it may be blood.  She denies any significant alcohol use.  No illicit drug use, specifically marijuana.  She works at [employer].  She denies any urinary symptoms or changes in her bowel habits.  No unilateral extremity swelling or edema, history of clots or clotting disorder, or difficulty breathing.  HPI     Past Medical  History:  Diagnosis Date  . GERD (gastroesophageal reflux disease)     There are no problems to display for this patient.   Past Surgical History:  Procedure Laterality Date  . FOOT SURGERY       OB History   No obstetric history on file.     History reviewed. No pertinent family history.  Social History   Tobacco Use  . Smoking status: Never Smoker  . Smokeless tobacco: Never Used  Substance Use Topics  . Alcohol use: Not Currently  . Drug use: Never    Home Medications Prior to Admission medications   Medication Sig Start Date End Date Taking? Authorizing Provider  ibuprofen (ADVIL,MOTRIN) 600 MG tablet Take 600 mg by mouth 2 (two) times daily as needed for headache or moderate pain.   Yes [provider]  Multiple Vitamin (MULTIVITAMIN WITH MINERALS) TABS tablet Take 1 tablet by mouth daily.   Yes [provider]  promethazine (PHENERGAN) 25 MG suppository Place 1 suppository (25 mg total) rectally every 6 (six) hours as needed for nausea or vomiting. 03/23/21  Yes Lorelee New, PA-C  sucralfate (CARAFATE) 1 g tablet Take 1 tablet (1 g total) by mouth 4 (four) times daily -  with meals and at bedtime. 03/23/21  Yes Lorelee New, PA-C  topiramate (TOPAMAX) 50 MG tablet Take 50 mg by mouth 2 (two) times daily as needed (migraine).   Yes [provider]  MedroxyPROGESTERone Acetate 150 MG/ML SUSY Inject  150 mg as directed every 3 (three) months. 11/08/17   [provider]    Allergies    Codeine, Penicillins, and Shrimp [shellfish allergy]  Review of Systems   Review of Systems  All other systems reviewed and are negative.   Physical Exam Updated Vital Signs BP 120/83   Pulse 60   Temp 98.2 F (36.8 C) (Oral)   Resp 18   Ht 5\' 9"  (1.753 m)   Wt 81.2 kg   SpO2 100%   BMI 26.43 kg/m   Physical Exam Vitals and nursing note reviewed. Exam conducted with a chaperone present.  Constitutional:      General: She is not  in acute distress. HENT:     Head: Normocephalic and atraumatic.  Eyes:     General: No scleral icterus.    Conjunctiva/sclera: Conjunctivae normal.  Cardiovascular:     Rate and Rhythm: Normal rate.     Pulses: Normal pulses.  Pulmonary:     Effort: Pulmonary effort is normal. No respiratory distress.  Abdominal:     General: Abdomen is flat. There is no distension.     Palpations: Abdomen is soft.     Tenderness: There is abdominal tenderness. There is no guarding.     Comments: Soft, nondistended.  TTP in LUQ, epigastrium, RUQ, and periumbilical region.  No overlying skin changes or guarding.  Musculoskeletal:     Right lower leg: No edema.     Left lower leg: No edema.  Skin:    General: Skin is dry.  Neurological:     Mental Status: She is alert.     GCS: GCS eye subscore is 4. GCS verbal subscore is 5. GCS motor subscore is 6.  Psychiatric:        Mood and Affect: Mood normal.        Behavior: Behavior normal.        Thought Content: Thought content normal.     ED Results / Procedures / Treatments   Labs (all labs ordered are listed, but only abnormal results are displayed) Labs Reviewed  COMPREHENSIVE METABOLIC PANEL - Abnormal; Notable for the following components:      Result Value   AST 14 (*)    Alkaline Phosphatase 34 (*)    All other components within normal limits  CBC WITH DIFFERENTIAL/PLATELET  LIPASE, BLOOD  TROPONIN I (HIGH SENSITIVITY)    EKG EKG Interpretation  Date/Time:  Monday March 23 2021 09:36:55 EDT Ventricular Rate:  71 PR Interval:  157 QRS Duration: 87 QT Interval:  409 QTC Calculation: 445 R Axis:   86 Text Interpretation: Sinus rhythm Right atrial enlargement No significant change since prior 9/21 Confirmed by 10/21 [phone]) on 03/23/2021 9:40:06 AM   Radiology DG Chest Portable 1 View  Result Date: 03/23/2021 CLINICAL DATA:  Chest pain. EXAM: PORTABLE CHEST 1 VIEW COMPARISON:  August 15, 2020. FINDINGS: The heart  size and mediastinal contours are within normal limits. Both lungs are clear. No visible pleural effusions or pneumothorax. No acute osseous abnormality. IMPRESSION: No active disease. Electronically Signed   By: August 17, 2020 MD   On: 03/23/2021 11:01    Procedures Procedures   Medications Ordered in ED Medications  sodium chloride 0.9 % bolus 1,000 mL (1,000 mLs Intravenous New Bag/Given 03/23/21 1118)  alum & mag hydroxide-simeth (MAALOX/MYLANTA) 200-200-20 MG/5ML suspension 30 mL (30 mLs Oral Given 03/23/21 1112)    ED Course  I have reviewed the triage vital signs and the nursing notes.  Pertinent labs & imaging results that were available during my care of the patient were reviewed by me and considered in my medical decision making (see chart for details).    MDM Rules/Calculators/A&P                          Kelly Finley was evaluated in Emergency Department on 03/23/2021 for the symptoms described in the history of present illness. She was evaluated in the context of the global COVID-19 pandemic, which necessitated consideration that the patient might be at risk for infection with the SARS-CoV-2 virus that causes COVID-19. Institutional protocols and algorithms that pertain to the evaluation of patients at risk for COVID-19 are in a state of rapid change based on information released by regulatory bodies including the CDC and federal and state organizations. These policies and algorithms were followed during the patient's care in the ED.  I personally reviewed patient's medical chart and all notes from triage and staff during today's encounter. I have also ordered and reviewed all labs and imaging that I felt to be medically necessary in the evaluation of this patient's complaints and with consideration of their physical exam. If needed, translation services were available and utilized.   Patient with upper abdominal/lower chest discomfort x1 day.  Acute on chronic.  Suspect  gastritis.  She is already taking Dexilant, will add Carafate.  Encouraging her to follow-up closely with her gastroenterologist for ongoing evaluation and management.  I have also asked her to call the office of her general surgeon to see if they can expedite her appointment.  EKG is personally reviewed with no significant changes when compared to prior tracings.  Chest x-ray is personally reviewed and without any acute cardiopulmonary disease.  Her laboratory work-up is also reviewed and entirely unremarkable.  PERC negative.  Discussed these findings with patient.  Discussed possible CT abdomen pelvis, but do not feel as though it is emergently warranted. They agreed.  We will discharge her home with Carafate and Phenergan suppositories.  She states that she is able to eat bagels, peanut butter, and other foods.  She states that she was even able to eat crab legs this past weekend.  She will follow-up with her gastroenterologist and general surgeon for ongoing evaluation management.  ED return precautions discussed.  They voiced understanding and are agreeable to the plan.  Final Clinical Impression(s) / ED Diagnoses Final diagnoses:  Nonspecific chest pain    Rx / DC Orders ED Discharge Orders         Ordered    promethazine (PHENERGAN) 25 MG suppository  Every 6 hours PRN        03/23/21 1223    sucralfate (CARAFATE) 1 g tablet  3 times daily with meals & bedtime        03/23/21 1223           Lorelee New, PA-C 03/23/21 1224    Terrilee Files, MD 03/23/21 1735

## 2021-03-23 NOTE — ED Triage Notes (Signed)
Complaining of midsternal pain. Hx GERD with medication that is not helping. Has vomiting with foods. HIDA scan completed. Is scheduled to see surgeon on 04/17/21.

## 2021-03-23 NOTE — Discharge Instructions (Signed)
Please take medications, as prescribed.  Do not combine the suppository Phenergan with oral Phenergan.  Your work-up today was reassuring.  Please follow-up with gastroenterology for ongoing evaluation and management.  Please also notify your primary care provider of today's ED encounter.  She can follow-up with the general surgeon sooner, that would be ideal.  Continue to try your best to eat and drink regularly.  Please read the attachment on brat diet.  Return to the ED or seek immediate medical attention should you experience any new or worsening symptoms.

## 2021-04-17 ENCOUNTER — Other Ambulatory Visit: Payer: Self-pay | Admitting: Surgery

## 2021-04-17 NOTE — Progress Notes (Signed)
Entered in error

## 2021-04-30 ENCOUNTER — Other Ambulatory Visit: Payer: Self-pay | Admitting: Surgery

## 2021-04-30 DIAGNOSIS — R1013 Epigastric pain: Secondary | ICD-10-CM

## 2021-05-27 ENCOUNTER — Ambulatory Visit
Admission: RE | Admit: 2021-05-27 | Discharge: 2021-05-27 | Disposition: A | Discharge status: Home / Self Care | Payer: Managed Care, Other (non HMO) | Source: Ambulatory Visit | Attending: Surgery | Admitting: Surgery

## 2021-05-27 DIAGNOSIS — R1013 Epigastric pain: Secondary | ICD-10-CM

## 2021-05-27 MED ORDER — IOPAMIDOL (ISOVUE-300) INJECTION 61%
100.0000 mL | Freq: Once | INTRAVENOUS | Status: AC | PRN
  Administered 2021-05-27: 10:00:00 100 mL via INTRAVENOUS

## 2021-06-15 ENCOUNTER — Other Ambulatory Visit (HOSPITAL_BASED_OUTPATIENT_CLINIC_OR_DEPARTMENT_OTHER): Payer: Self-pay

## 2021-06-15 ENCOUNTER — Emergency Department (HOSPITAL_BASED_OUTPATIENT_CLINIC_OR_DEPARTMENT_OTHER)
Admission: EM | Admit: 2021-06-15 | Discharge: 2021-06-15 | Disposition: A | Discharge status: Home / Self Care | Payer: Managed Care, Other (non HMO) | Attending: Emergency Medicine | Admitting: Emergency Medicine

## 2021-06-15 ENCOUNTER — Emergency Department (HOSPITAL_BASED_OUTPATIENT_CLINIC_OR_DEPARTMENT_OTHER): Payer: Managed Care, Other (non HMO)

## 2021-06-15 ENCOUNTER — Encounter (HOSPITAL_BASED_OUTPATIENT_CLINIC_OR_DEPARTMENT_OTHER): Payer: Self-pay | Admitting: *Deleted

## 2021-06-15 ENCOUNTER — Other Ambulatory Visit: Payer: Self-pay

## 2021-06-15 DIAGNOSIS — U071 COVID-19: Secondary | ICD-10-CM | POA: Diagnosis not present

## 2021-06-15 DIAGNOSIS — R059 Cough, unspecified: Secondary | ICD-10-CM | POA: Diagnosis present

## 2021-06-15 LAB — BASIC METABOLIC PANEL
Anion gap: 3 — ABNORMAL LOW (ref 5–15)
BUN: 14 mg/dL (ref 6–20)
CO2: 28 mmol/L (ref 22–32)
Calcium: 8.6 mg/dL — ABNORMAL LOW (ref 8.9–10.3)
Chloride: 105 mmol/L (ref 98–111)
Creatinine, Ser: 1.06 mg/dL — ABNORMAL HIGH (ref 0.44–1.00)
GFR, Estimated: >60 mL/min (ref >60)
Glucose, Bld: 91 mg/dL (ref 70–99)
Potassium: 3.9 mmol/L (ref 3.5–5.1)
Sodium: 136 mmol/L (ref 135–145)

## 2021-06-15 MED ORDER — NIRMATRELVIR/RITONAVIR (PAXLOVID)TABLET: 3.0000 | ORAL_TABLET | Freq: Two times a day (BID) | ORAL | 0 refills | Status: DC

## 2021-06-15 MED ORDER — DOXYCYCLINE HYCLATE 100 MG PO CAPS
100.0000 mg | ORAL_CAPSULE | Freq: Two times a day (BID) | ORAL | 0 refills | Status: DC
  Filled 2021-06-15: qty 20, 10d supply, fill #0

## 2021-06-15 MED ORDER — BENZONATATE 100 MG PO CAPS: 100.0000 mg | ORAL_CAPSULE | Freq: Three times a day (TID) | ORAL | 0 refills | Status: DC

## 2021-06-15 MED ORDER — FLUCONAZOLE 200 MG PO TABS
200.0000 mg | ORAL_TABLET | Freq: Every day | ORAL | 0 refills | Status: AC
  Filled 2021-06-15: qty 2, 2d supply, fill #0

## 2021-06-15 MED ORDER — NIRMATRELVIR/RITONAVIR (PAXLOVID)TABLET
3.0000 | ORAL_TABLET | Freq: Two times a day (BID) | ORAL | 0 refills | Status: AC
  Filled 2021-06-15: qty 30, 5d supply, fill #0

## 2021-06-15 MED ORDER — BENZONATATE 100 MG PO CAPS
100.0000 mg | ORAL_CAPSULE | Freq: Three times a day (TID) | ORAL | 0 refills | Status: DC
  Filled 2021-06-15: qty 21, 7d supply, fill #0

## 2021-06-15 NOTE — Discharge Instructions (Addendum)
Please take the Paxil Oved twice daily for the next 5 days.  This medicine will help resolve symptoms with COVID, although it will not make the virus clear quicker.  He may also take the [facility] every 8 hours as needed for cough.  Continue to drink hot tea as that will soothe your throat.  Continue to take Tylenol as needed for body aches and fever.  Take the doxycycline twice daily for the next 10 days.  Please continue to take it even if you start to feel better.  I also prescribed you fluconazole if you need it for a possible yeast infection.  You do not need to take this if you do not develop a yeast infection.

## 2021-06-15 NOTE — ED Provider Notes (Signed)
[facility] EMERGENCY DEPARTMENT Provider Note   CSN: [id number] Arrival date & time: 06/15/21  1546     History Chief Complaint  Patient presents with   Covid Positive    Kelly Finley is a 40 y.o. female.  HPI  Patient presents with cough x3 days.  There is also associated body aches, nausea, 1 episode of emesis, 1 episode of coughing up streaks of blood.  She has tried Robitussin without relief.  Tylenol assist with body aches.  She tested positive for COVID earlier today.  She is vaccinated x2 but not boosted.  Past Medical History:  Diagnosis Date   GERD (gastroesophageal reflux disease)     There are no problems to display for this patient.   Past Surgical History:  Procedure Laterality Date   FOOT SURGERY       OB History   No obstetric history on file.     No family history on file.  Social History   Tobacco Use   Smoking status: Never   Smokeless tobacco: Never  Substance Use Topics   Alcohol use: Not Currently   Drug use: Never    Home Medications Prior to Admission medications   Medication Sig Start Date End Date Taking? Authorizing Provider  ibuprofen (ADVIL,MOTRIN) 600 MG tablet Take 600 mg by mouth 2 (two) times daily as needed for headache or moderate pain.    [provider]  MedroxyPROGESTERone Acetate 150 MG/ML SUSY Inject 150 mg as directed every 3 (three) months. 11/08/17   [provider]  Multiple Vitamin (MULTIVITAMIN WITH MINERALS) TABS tablet Take 1 tablet by mouth daily.    [provider]  promethazine (PHENERGAN) 25 MG suppository Place 1 suppository (25 mg total) rectally every 6 (six) hours as needed for nausea or vomiting. 03/23/21   Lorelee New, PA-C  sucralfate (CARAFATE) 1 g tablet Take 1 tablet (1 g total) by mouth 4 (four) times daily -  with meals and at bedtime. 03/23/21   Lorelee New, PA-C  topiramate (TOPAMAX) 50 MG tablet Take 50 mg by mouth 2 (two) times daily as needed  (migraine).    [provider]    Allergies    Codeine, Penicillins, and Shrimp [shellfish allergy]  Review of Systems   Review of Systems  Constitutional:  Positive for fever.  HENT:  Positive for sore throat.   Respiratory:  Positive for cough. Negative for shortness of breath.   Cardiovascular:  Negative for chest pain and leg swelling.  Gastrointestinal:  Positive for nausea and vomiting. Negative for abdominal pain.   Physical Exam Updated Vital Signs BP (!) 155/92 (BP Location: Left Arm)   Pulse 67   Temp 98.5 F (36.9 C) (Oral)   Resp 16   Ht 5\' 9"  (1.753 m)   Wt 77.1 kg   SpO2 100%   BMI 25.10 kg/m   Physical Exam Vitals and nursing note reviewed. Exam conducted with a chaperone present.  Constitutional:      General: She is not in acute distress.    Appearance: Normal appearance.  HENT:     Head: Normocephalic and atraumatic.  Eyes:     General: No scleral icterus.    Extraocular Movements: Extraocular movements intact.     Pupils: Pupils are equal, round, and reactive to light.  Cardiovascular:     Rate and Rhythm: Normal rate and regular rhythm.  Pulmonary:     Effort: Pulmonary effort is normal.     Breath  sounds: Normal breath sounds.  Skin:    Coloration: Skin is not jaundiced.  Neurological:     Mental Status: She is alert. Mental status is at baseline.     Coordination: Coordination normal.    ED Results / Procedures / Treatments   Labs (all labs ordered are listed, but only abnormal results are displayed) Labs Reviewed  BASIC METABOLIC PANEL    EKG None  Radiology No results found.  Procedures Procedures   Medications Ordered in ED Medications - No data to display  ED Course  I have reviewed the triage vital signs and the nursing notes.  Pertinent labs & imaging results that were available during my care of the patient were reviewed by me and considered in my medical decision making (see chart for details).    MDM  Rules/Calculators/A&P                          Patient is stable vitals, although she is mildly hypertensive.  Check kidney function to prescribe Paxil Oved.  We will also get an xray to evaluate for pneumonia.  X-ray shows signs atelectasis which can be an early sign of an atypical pneumonia.  I will prescribe her doxycycline to take twice daily for 10 days.  Also give Paxlovid and Tessalon Perles for coughing.  Patient verbalized desire to have fluconazole in case she develops a yeast infection.  We will oblige.  Return precautions discussed.  Patient is in agreement with the plan.  Final Clinical Impression(s) / ED Diagnoses Final diagnoses:  None    Rx / DC Orders ED Discharge Orders     None        Theron Arista, PA-C 06/15/21 1755    Cathren Laine, MD 06/17/21 1015

## 2021-06-15 NOTE — ED Triage Notes (Signed)
C/o covid +  home test today with sx  x 3 days

## 2021-06-16 ENCOUNTER — Other Ambulatory Visit (HOSPITAL_BASED_OUTPATIENT_CLINIC_OR_DEPARTMENT_OTHER): Payer: Self-pay

## 2021-07-20 DIAGNOSIS — K5904 Chronic idiopathic constipation: Secondary | ICD-10-CM | POA: Insufficient documentation

## 2021-10-09 DIAGNOSIS — K30 Functional dyspepsia: Secondary | ICD-10-CM | POA: Insufficient documentation

## 2021-12-29 ENCOUNTER — Encounter: Payer: Self-pay | Admitting: Physical Therapy

## 2021-12-29 ENCOUNTER — Ambulatory Visit: Payer: Managed Care, Other (non HMO) | Attending: Family Medicine | Admitting: Physical Therapy

## 2021-12-29 ENCOUNTER — Other Ambulatory Visit: Payer: Self-pay

## 2021-12-29 DIAGNOSIS — R29898 Other symptoms and signs involving the musculoskeletal system: Secondary | ICD-10-CM | POA: Insufficient documentation

## 2021-12-29 DIAGNOSIS — M25561 Pain in right knee: Secondary | ICD-10-CM | POA: Insufficient documentation

## 2021-12-29 DIAGNOSIS — R2689 Other abnormalities of gait and mobility: Secondary | ICD-10-CM | POA: Insufficient documentation

## 2021-12-29 DIAGNOSIS — R600 Localized edema: Secondary | ICD-10-CM | POA: Insufficient documentation

## 2021-12-29 DIAGNOSIS — M6281 Muscle weakness (generalized): Secondary | ICD-10-CM | POA: Diagnosis present

## 2021-12-29 DIAGNOSIS — R262 Difficulty in walking, not elsewhere classified: Secondary | ICD-10-CM | POA: Diagnosis present

## 2021-12-29 DIAGNOSIS — M25661 Stiffness of right knee, not elsewhere classified: Secondary | ICD-10-CM | POA: Insufficient documentation

## 2021-12-29 NOTE — Therapy (Signed)
[facility] [address]  [place], [place], [address] Phone: [phone]   Fax:  [phone]  Physical Therapy Evaluation  Patient Details  Name: Kelly Finley MRN: [id number] Date of Birth: February 16, 1981 Referring Provider (PT): Maggie Font, MD   Encounter Date: 12/29/2021   PT End of Session - 12/29/21 0942     Visit Number 1    Number of Visits 12    Date for PT Re-Evaluation 02/09/22    Authorization Type [employer] - VL: 78    PT Start Time [id number]    PT Stop Time 1101    PT Time Calculation (min) 79 min    Activity Tolerance Patient tolerated treatment well;Patient limited by pain;Other (comment)   Pt apprehensive due fear of further injury   Behavior During Therapy WFL for tasks assessed/performed             Past Medical History:  Diagnosis Date   GERD (gastroesophageal reflux disease)     Past Surgical History:  Procedure Laterality Date   FOOT SURGERY      There were no vitals filed for this visit.    Subjective Assessment - 12/29/21 0949     Subjective Pt reports she was doing a SWAT workout at work on 11/10/22 and noted onset of R knee pain after doing leg press with swelling onset later in the day. WC sent her to MD who told her she likely had torn a muscle but WC has denied coverage for further treatment stating it was not a work-related injury. She has received pain med and cortisone injections with some relief of pain and edema. Pain and stiffness now worst in morning. Wears knee brace provided by MD all the time at work and if she will be on her feet a lot.    Limitations Sitting;Standing;Walking;House hold activities    How long can you sit comfortably? mostly limited with sitting on a high stool    How long can you stand comfortably? a couple hours with seated rest breaks    How long can you walk comfortably? 4,000 steps per day in brace - normal workday pre-injury could be up to 18-20,000  steps    Diagnostic tests 11/27/21 R knee x-ray: 1.  No acute fracture or malalignment.   2.  No joint effusion.   3.  Mild medial and patellofemoral compartment degenerative changes.    Patient Stated Goals "get back to 100% including running for work"    Currently in Pain? No/denies    Pain Score 0-No pain   up to 5-6/10   Pain Location Knee    Pain Orientation Right;Medial    Pain Descriptors / Indicators Tightness;Aching    Pain Type Acute pain    Pain Radiating Towards along medial joint line    Pain Onset More than a month ago   11/10/21   Pain Frequency Intermittent    Aggravating Factors  bending knee back, prolonged walking, climbing stairs    Pain Relieving Factors ice sleeve, Tylenol    Effect of Pain on Daily Activities unable to walk long distances or run which is necessary for her job, difficulty climbing stairs                [facility] PT Assessment - 12/29/21 0942       Assessment   Medical Diagnosis Acute pain of R knee    Referring Provider (PT) Maggie Font, MD    Onset Date/Surgical  Date 11/10/21    Hand Dominance Right    Next MD Visit 01/26/22    Prior Therapy none      Precautions   Required Braces or Orthoses Other Brace/Splint    Other Brace/Splint hinged neoprene knee brace      Restrictions   Other Position/Activity Restrictions no bending. lifting >50#, no climbing stairs, no running      Balance Screen   Has the patient fallen in the past 6 months No    Has the patient had a decrease in activity level because of a fear of falling?  Yes    Is the patient reluctant to leave their home because of a fear of falling?  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    Leisure running 2x/wk, working out at least 3x/wk, walking 18-20K steps while working & walks for exercise on days off      Cognition   Overall Cognitive Status Within Functional Limits for tasks assessed      Observation/Other Assessments   Focus on Therapeutic Outcomes (FOTO)  Knee = 66; predicted D/C FS = 80      Observation/Other Assessments-Edema    Edema --   diffuse medial R knee and thigh edema     ROM / Strength   AROM / PROM / Strength AROM;PROM;Strength      AROM   AROM Assessment Site Knee    Right/Left Knee Right;Left    Right Knee Extension -10    Right Knee Flexion 120   limited by tightness and soreness   Left Knee Extension 2    Left Knee Flexion 132      PROM   PROM Assessment Site Knee    Right/Left Knee Right    Right Knee Extension 2      Strength   Strength Assessment Site Hip;Knee;Ankle    Right/Left Hip Right;Left    Right Hip Flexion 4-/5    Right Hip Extension 4/5    Right Hip External Rotation  3+/5   pain at medial knee   Right Hip Internal Rotation 4-/5   pulling on lateral knee   Right Hip ABduction 4-/5    Right Hip ADduction 3-/5    Left Hip Flexion 5/5    Left Hip Extension 4+/5    Left Hip External Rotation 4/5    Left Hip Internal Rotation 4+/5    Left Hip ABduction 5/5    Left Hip ADduction 4+/5    Right/Left Knee Right;Left    Right Knee Flexion 4-/5    Right Knee Extension 4-/5    Left Knee Flexion 4+/5    Left Knee Extension 5/5    Right/Left Ankle Right;Left    Right Ankle Dorsiflexion 4-/5    Right Ankle Plantar Flexion 3/5   pt hesistant to attempt SLS heel raise   Left Ankle Dorsiflexion 5/5  Left Ankle Plantar Flexion 5/5      Flexibility   Soft Tissue Assessment /Muscle Length yes     Hamstrings mod tight R, mild tight L    Quadriceps mod tight R, mild tight L    ITB mod tight R, mild tight L    Piriformis mod tight R, mild tight L      Palpation   Palpation comment TTP at R knee medial joint line and medial patella as well as in distal medial hip adductors and HS      Ambulation/Gait   Ambulation/Gait Yes    Assistive device None    Gait Pattern Antalgic;Decreased weight shift to right;Decreased stance time - right;Decreased step length - left;Decreased stride length    Ambulation Surface Level;Indoor                        Objective measurements completed on examination: See above findings.       [facility] Adult PT Treatment/Exercise - 12/29/21 0942       Exercises   Exercises Knee/Hip      Knee/Hip Exercises: Stretches   Passive Hamstring Stretch Right;2 reps;30 seconds    Passive Hamstring Stretch Limitations hoolkying with strap & seated hip hinge with strap    Quad Stretch Right;2 reps;30 seconds    Quad Stretch Limitations prone with strap    ITB Stretch Right;2 reps;30 seconds    ITB Stretch Limitations supine cross-body with strap & standing lateral flexion      Knee/Hip Exercises: Supine   Heel Slides Right;10 reps;AROM;AAROM    Heel Slides Limitations long-sitting with strap                     PT Education - 12/29/21 1058     Education Details PT eval findings, anticipated POC and initial HEP - Access Code: [email or web address]    Person(s) Educated Patient    Methods Explanation;Demonstration;Verbal cues;Tactile cues;Handout    Comprehension Verbalized understanding;Verbal cues required;Tactile cues required;Returned demonstration;Need further instruction              PT Short Term Goals - 12/29/21 1101       PT SHORT TERM GOAL #1   Title Patient will be independent with initial HEP    Status New    Target Date 01/19/22      PT SHORT TERM GOAL #2   Title Patient to report reduction in frequency and intensity of R  knee pain by >/= 25-50% to allow for improved activity tolerance    Status New    Target Date 01/19/22               PT Long Term Goals - 12/29/21 1101       PT LONG TERM GOAL #1   Title Patient will be independent with ongoing/advanced HEP for self-management at home    Status New    Target Date 02/09/22      PT LONG TERM GOAL #2   Title Patient to improve R knee AROM >/= 0-130 without pain provocation    Status New    Target Date 02/09/22      PT LONG TERM GOAL #3   Title Patient will demonstrate improved R LE strength to >/= 4+/5 for improved stability and ease of mobility    Status New    Target Date 02/09/22      PT LONG TERM GOAL #4   Title Patient will be able to ambulate  with normal mechanics over level and uneven terrain without limitation due to R knee pain    Status New    Target Date 02/09/22      PT LONG TERM GOAL #5   Title Patient will negotiate stairs reciprocally with normal step pattern w/o limitation due to R knee pain or weakness    Status New    Target Date 02/09/22      PT LONG TERM GOAL #6   Title Patient to report ability to perform ADLs, household, and work-related tasks including running without limitation due to R knee pain, LOM or weakness    Status New    Target Date 02/09/22                    Plan - 12/29/21 1101     Clinical Impression Statement Faustine is a 41 y/o female who presents to OP PT for acute R knee pain and edema arising on 11/10/22 after completing a work-out on 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 She is currently on restricted duty at work and wears a hinged knee brace while working or with extended time on her feet/walking. Pain and edema have improved somewhat since injection at MD office, but she remains TTP at R knee medial joint line and medial patella as well as in distal  medial hip adductors and hamstrings. Current deficits include R medial knee pain, diffuse medial knee and thigh edema, antalgic gait, decreased R knee ROM (AROM 10-120 with pain at end range flexion/extension, with PROM extension to 2 hyperextension) relative to L, mild to moderate tightness vs guarding in B proximal LE muscle groups (R>L), and mild to moderate proximal R LE weakness. 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    Personal Factors and Comorbidities Comorbidity 2;Past/Current Experience;Time since onset of injury/illness/exacerbation;Profession    Comorbidities GERD/gastroparesis, migraines    Examination-Activity Limitations Bend;Caring for Others;Lift;Locomotion Level;Sit;Squat;Stairs;Stand;Transfers    Examination-Participation Restrictions Cleaning;Community Activity;Driving;Interpersonal Relationship;Meal Prep;Occupation;Shop    Stability/Clinical Decision Making Stable/Uncomplicated    Clinical Decision Making Low    Rehab Potential Excellent    PT Frequency 2x / week    PT Duration 6 weeks    PT Treatment/Interventions ADLs/Self Care Home Management;Cryotherapy;Electrical Stimulation;Iontophoresis 4mg /ml Dexamethasone;Moist Heat;Ultrasound;Gait training;Stair training;Functional mobility training;Therapeutic activities;Therapeutic exercise;Balance training;Neuromuscular re-education;Patient/family education;Manual techniques;Passive range of motion;Dry needling;Taping;Joint Manipulations    PT Next Visit Plan Trial of Ktape for pain/edema management; gentle R knee ROM/stretching; LE strengthening to improve knee stability; gait training to improve gait pattern with increased weight shift to R LE; MT and modalities to address abnormal muscle  tension/guarding, edema and pain (vaso not covered by Christella Scheuermann)    PT Home Exercise Plan Access Code: [email or web address] (1/31)    Consulted and Agree with Plan of Care Patient             Patient will benefit from skilled therapeutic intervention in order to improve the following deficits and impairments:  Abnormal gait, Decreased activity tolerance, Decreased balance, Decreased endurance, Decreased knowledge of precautions, Decreased mobility, Decreased range of motion, Decreased strength, Difficulty walking, Increased edema, Increased fascial restricitons, Increased muscle spasms, Impaired perceived functional  ability, Impaired flexibility, Improper body mechanics, Postural dysfunction, Pain  Visit Diagnosis: Acute pain of right knee  Stiffness of right knee, not elsewhere classified  Muscle weakness (generalized)  Other abnormalities of gait and mobility  Difficulty in walking, not elsewhere classified  Other symptoms and signs involving the musculoskeletal system  Localized edema     Problem List There are no problems to display for this patient.   Percival Spanish, PT 12/29/2021, 7:06 PM  [facility] [address]  [place], [place], [address] Phone: [phone]   Fax:  [phone]  Name: JEYLI POPPA MRN: [id number] Date of Birth: 1981/02/15

## 2021-12-29 NOTE — Patient Instructions (Signed)
° ° °  Access Code: [email or web address] URL: [email or web address] Date: 12/29/2021 Prepared by: Annie Paras  Exercises Seated Hamstring Stretch with Strap - 2-3 x daily - 7 x weekly - 3 reps - 30 sec hold Standing ITB Stretch - 2-3 x daily - 7 x weekly - 3 reps - 30 sec hold Prone Quadriceps Stretch with Strap - 2-3 x daily - 7 x weekly - 3 reps - 30 sec hold Sitting Heel Slide with Towel - 2 x daily - 7 x weekly - 2 sets - 10 reps - 3 sec hold

## 2022-01-07 ENCOUNTER — Ambulatory Visit: Payer: Managed Care, Other (non HMO) | Attending: Family Medicine

## 2022-01-07 ENCOUNTER — Other Ambulatory Visit: Payer: Self-pay

## 2022-01-07 DIAGNOSIS — R600 Localized edema: Secondary | ICD-10-CM

## 2022-01-07 DIAGNOSIS — R29898 Other symptoms and signs involving the musculoskeletal system: Secondary | ICD-10-CM

## 2022-01-07 DIAGNOSIS — M25561 Pain in right knee: Secondary | ICD-10-CM

## 2022-01-07 DIAGNOSIS — R2689 Other abnormalities of gait and mobility: Secondary | ICD-10-CM | POA: Diagnosis present

## 2022-01-07 DIAGNOSIS — R262 Difficulty in walking, not elsewhere classified: Secondary | ICD-10-CM | POA: Diagnosis present

## 2022-01-07 DIAGNOSIS — M25661 Stiffness of right knee, not elsewhere classified: Secondary | ICD-10-CM | POA: Diagnosis present

## 2022-01-07 DIAGNOSIS — M6281 Muscle weakness (generalized): Secondary | ICD-10-CM

## 2022-01-07 NOTE — Therapy (Addendum)
[facility] [address]  [place], [place], [address] Phone: [phone]   Fax:  [phone]  Physical Therapy Treatment  Patient Details  Name: Kelly Finley MRN: [id number] Date of Birth: 06-12-1981 Referring Provider (PT): Maggie Font, MD   Encounter Date: 01/07/2022   PT End of Session - 01/07/22 0940     Visit Number 2    Number of Visits 12    Date for PT Re-Evaluation 02/09/22    Authorization Type [employer] - VL: 21    PT Start Time 0845    PT Stop Time 0933    PT Time Calculation (min) 48 min    Activity Tolerance Patient tolerated treatment well;Patient limited by pain    Behavior During Therapy [facility] for tasks assessed/performed             Past Medical History:  Diagnosis Date   GERD (gastroesophageal reflux disease)     Past Surgical History:  Procedure Laterality Date   FOOT SURGERY      There were no vitals filed for this visit.   Subjective Assessment - 01/07/22 0847     Subjective Pt reports that she is doing ok with the brace at work but steps are not great. Some soreness on the front of the knee.    Diagnostic tests 11/27/21 R knee x-ray: 1.  No acute fracture or malalignment.   2.  No joint effusion.   3.  Mild medial and patellofemoral compartment degenerative changes.    Patient Stated Goals "get back to 100% including running for work"    Currently in Pain? No/denies                               [facility] Adult PT Treatment/Exercise - 01/07/22 0001       Exercises   Exercises Knee/Hip      Knee/Hip Exercises: Stretches   Quad Stretch Right;2 reps;30 seconds    Quad Stretch Limitations prone with strap, supine HF with strap    ITB Stretch Limitations reviewed, pt reported more stretch in oblique      Knee/Hip Exercises: Aerobic   Recumbent Bike L1x37min      Knee/Hip Exercises: Supine   Hip Adduction Isometric Strengthening;Both;2 sets;10 reps    Hip  Adduction Isometric Limitations 3 sec hold    Bridges Strengthening;Both;10 reps    Straight Leg Raises Strengthening;Right;10 reps    Straight Leg Raises Limitations small ROM, very difficult for pt      Knee/Hip Exercises: Sidelying   Clams R clamshells with RTB 10x3"      Manual Therapy   Manual Therapy Taping    Kinesiotex Create Space      Kinesiotix   Create Space X strip one below knee joint, one along medial knee                       PT Short Term Goals - 01/07/22 1118       PT SHORT TERM GOAL #1   Title Patient will be independent with initial HEP    Status On-going    Target Date 01/19/22      PT SHORT TERM GOAL #2   Title Patient to report reduction in frequency and intensity of R knee pain by >/= 25-50% to allow for improved activity tolerance    Status On-going    Target Date 01/19/22  PT Long Term Goals - 01/07/22 1118       PT LONG TERM GOAL #1   Title Patient will be independent with ongoing/advanced HEP for self-management at home    Status On-going    Target Date 02/09/22      PT LONG TERM GOAL #2   Title Patient to improve R knee AROM >/= 0-130 without pain provocation    Status On-going    Target Date 02/09/22      PT LONG TERM GOAL #3   Title Patient will demonstrate improved R LE strength to >/= 4+/5 for improved stability and ease of mobility    Status On-going    Target Date 02/09/22      PT LONG TERM GOAL #4   Title Patient will be able to ambulate with normal mechanics over level and uneven terrain without limitation due to R knee pain    Status On-going    Target Date 02/09/22      PT LONG TERM GOAL #5   Title Patient will negotiate stairs reciprocally with normal step pattern w/o limitation due to R knee pain or weakness    Status On-going    Target Date 02/09/22      PT LONG TERM GOAL #6   Title Patient to report ability to perform ADLs, household, and work-related tasks including running  without limitation due to R knee pain, LOM or weakness    Status On-going    Target Date 02/09/22                   Plan - 01/07/22 1016     Clinical Impression Statement Updated HEP today with hip strengthening exercises. Pt demonstrated global weakness in her hips today more notably with the hip ADD and SLR. She reported pain with the quad stretch in prone but as we reviewed she noted more of a discomfort, she was advised that if the discomfort worsened to stop the exercise. Also reviewed the ITB stretch, she seemed to get more of a stretch w/o use of her UE, so we gave an alternate version. Finished session with KT tape along the medial for pain.    Personal Factors and Comorbidities Comorbidity 2;Past/Current Experience;Time since onset of injury/illness/exacerbation;Profession    Comorbidities GERD/gastroparesis, migraines    PT Frequency 2x / week    PT Duration 6 weeks    PT Treatment/Interventions ADLs/Self Care Home Management;Cryotherapy;Electrical Stimulation;Iontophoresis 4mg /ml Dexamethasone;Moist Heat;Ultrasound;Gait training;Stair training;Functional mobility training;Therapeutic activities;Therapeutic exercise;Balance training;Neuromuscular re-education;Patient/family education;Manual techniques;Passive range of motion;Dry needling;Taping;Joint Manipulations    PT Next Visit Plan assess response to KT tape; gentle R knee ROM/stretching; LE strengthening to improve knee stability; gait training to improve gait pattern with increased weight shift to R LE; MT and modalities to address abnormal muscle tension/guarding, edema and pain (vaso not covered by [facility]uermann)    PT Home Exercise Plan Access Code: [email or web address] (1/31)    Consulted and Agree with Plan of Care Patient             Patient will benefit from skilled therapeutic intervention in order to improve the following deficits and impairments:  Abnormal gait, Decreased activity tolerance, Decreased balance, Decreased  endurance, Decreased knowledge of precautions, Decreased mobility, Decreased range of motion, Decreased strength, Difficulty walking, Increased edema, Increased fascial restricitons, Increased muscle spasms, Impaired perceived functional ability, Impaired flexibility, Improper body mechanics, Postural dysfunction, Pain  Visit Diagnosis: Acute pain of right knee  Stiffness of right knee, not elsewhere classified  Muscle weakness (generalized)  Other abnormalities of gait and mobility  Difficulty in walking, not elsewhere classified  Other symptoms and signs involving the musculoskeletal system  Localized edema     Problem List There are no problems to display for this patient.   Artist Pais, PTA 01/07/2022, 11:20 AM  [facility] [address]  [place], [place], [address] Phone: [phone]   Fax:  [phone]  Name: Kelly Finley MRN: [id number] Date of Birth: Jul 06, 1981

## 2022-01-07 NOTE — Patient Instructions (Signed)
Access Code: [email or web address] URL: [email or web address] Date: 01/07/2022 Prepared by: Verta Ellen  Exercises Seated Hamstring Stretch with Strap - 2-3 x daily - 7 x weekly - 3 reps - 30 sec hold ITB Stretch at Wall - 1 x daily - 7 x weekly - 3 sets - 10 reps Prone Quadriceps Stretch with Strap - 2-3 x daily - 7 x weekly - 3 reps - 30 sec hold Sitting Heel Slide with Towel - 2 x daily - 7 x weekly - 2 sets - 10 reps - 3 sec hold Supine Hip Adduction Isometric with Ball - 2 x daily - 7 x weekly - 2 sets - 10 reps - 3 sec hold Small Range Straight Leg Raise - 2 x daily - 7 x weekly - 2 sets - 10 reps Clamshell with Resistance - 2 x daily - 7 x weekly - 2 sets - 10 reps

## 2022-01-11 ENCOUNTER — Ambulatory Visit: Payer: Managed Care, Other (non HMO) | Admitting: Physical Therapy

## 2022-01-11 ENCOUNTER — Encounter: Payer: Self-pay | Admitting: Physical Therapy

## 2022-01-11 ENCOUNTER — Other Ambulatory Visit: Payer: Self-pay

## 2022-01-11 DIAGNOSIS — M25561 Pain in right knee: Secondary | ICD-10-CM | POA: Diagnosis not present

## 2022-01-11 DIAGNOSIS — M6281 Muscle weakness (generalized): Secondary | ICD-10-CM

## 2022-01-11 DIAGNOSIS — R600 Localized edema: Secondary | ICD-10-CM

## 2022-01-11 DIAGNOSIS — R2689 Other abnormalities of gait and mobility: Secondary | ICD-10-CM

## 2022-01-11 DIAGNOSIS — R29898 Other symptoms and signs involving the musculoskeletal system: Secondary | ICD-10-CM

## 2022-01-11 DIAGNOSIS — R262 Difficulty in walking, not elsewhere classified: Secondary | ICD-10-CM

## 2022-01-11 DIAGNOSIS — M25661 Stiffness of right knee, not elsewhere classified: Secondary | ICD-10-CM

## 2022-01-11 NOTE — Therapy (Signed)
[facility] [address]  [address] [place], [place], [address] Phone: [phone]   Fax:  [phone]  Physical Therapy Treatment  Patient Details  Name: Kelly Finley MRN: [id number] Date of Birth: 1981/01/24 Referring Provider (PT): Norberta Keens, MD   Encounter Date: 01/11/2022   PT End of Session - 01/11/22 1100     Visit Number 3    Number of Visits 12    Date for PT Re-Evaluation 02/09/22    Authorization Type [employer] - VL: 60    PT Start Time 1100    PT Stop Time 1152    PT Time Calculation (min) 52 min    Activity Tolerance Patient tolerated treatment well;Patient limited by pain    Behavior During Therapy [facility] for tasks assessed/performed             Past Medical History:  Diagnosis Date   GERD (gastroesophageal reflux disease)     Past Surgical History:  Procedure Laterality Date   FOOT SURGERY      There were no vitals filed for this visit.   Subjective Assessment - 01/11/22 1103     Subjective Pt denies pain but reports "just stiffness". Pt reports only 3x completing HEP this week due to long work day schedules (13 hr work day). Pt reports Ktape seemed to help but came off the first time she showered.    Diagnostic tests 11/27/21 R knee x-ray: 1.  No acute fracture or malalignment.   2.  No joint effusion.   3.  Mild medial and patellofemoral compartment degenerative changes.    Patient Stated Goals "get back to 100% including running for work"    Currently in Pain? No/denies                               [facility] Adult PT Treatment/Exercise - 01/11/22 1100       Exercises   Exercises Knee/Hip      Knee/Hip Exercises: Stretches   Passive Hamstring Stretch Right;2 reps;30 seconds    Passive Hamstring Stretch Limitations hooklying with strap    Quad Stretch Right;2 reps;30 seconds    Quad Stretch Limitations prone with strap & prone RF with strap    ITB Stretch Right;3  reps;30 seconds    ITB Stretch Limitations standing lateral flexion and lean into wall; supine cross-body with strap - better stretch in supine today      Knee/Hip Exercises: Aerobic   Recumbent Bike L2 x 6 min      Knee/Hip Exercises: Standing   Terminal Knee Extension Right;20 reps;Strengthening;Theraband    Theraband Level (Terminal Knee Extension) Level 4 (Blue)    Forward Step Up Right;10 reps;Step Height: 6";Hand Hold: 1    Forward Step Up Limitations green TB TKE added after a few reps with better tolerance noted    Functional Squat 10 reps;5 seconds;2 sets    Functional Squat Limitations counter squat & TRX squat working toward light chair touch x 1 set each; green TB hip abduction isometric added after ~5 reps with counter squat & utilized for full set with TRX squat    Wall Squat 5 reps;5 seconds    Wall Squat Limitations mini-squat + hip adduction ball squeeze                     PT Education - 01/11/22 1152     Education Details HEP update  Person(s) Educated Patient    Methods Explanation;Demonstration;Verbal cues;Handout    Comprehension Verbalized understanding;Verbal cues required;Returned demonstration;Need further instruction              PT Short Term Goals - 01/07/22 1118       PT SHORT TERM GOAL #1   Title Patient will be independent with initial HEP    Status On-going    Target Date 01/19/22      PT SHORT TERM GOAL #2   Title Patient to report reduction in frequency and intensity of R knee pain by >/= 25-50% to allow for improved activity tolerance    Status On-going    Target Date 01/19/22               PT Long Term Goals - 01/07/22 1118       PT LONG TERM GOAL #1   Title Patient will be independent with ongoing/advanced HEP for self-management at home    Status On-going    Target Date 02/09/22      PT LONG TERM GOAL #2   Title Patient to improve R knee AROM >/= 0-130 without pain provocation    Status On-going     Target Date 02/09/22      PT LONG TERM GOAL #3   Title Patient will demonstrate improved R LE strength to >/= 4+/5 for improved stability and ease of mobility    Status On-going    Target Date 02/09/22      PT LONG TERM GOAL #4   Title Patient will be able to ambulate with normal mechanics over level and uneven terrain without limitation due to R knee pain    Status On-going    Target Date 02/09/22      PT LONG TERM GOAL #5   Title Patient will negotiate stairs reciprocally with normal step pattern w/o limitation due to R knee pain or weakness    Status On-going    Target Date 02/09/22      PT LONG TERM GOAL #6   Title Patient to report ability to perform ADLs, household, and work-related tasks including running without limitation due to R knee pain, LOM or weakness    Status On-going    Target Date 02/09/22                   Plan - 01/11/22 1104     Clinical Impression Statement Kelly Finley reports she still has difficulty feeling the ITB stretch, therefore reattempted supine crossbody with strap today - pt noting good stretch and better tolerance than with initial attempt on eval visit. She expresses concerns about being able to run, manage stairs reciprocally as well squat to be able to meet job requirement expectations, therefore todays exercises and activities working toward these tasks. Pt demonstrating difficulty recruiting R quads with steps-up and reverse step-downs but improved with addition of green TB TKE, more so with step-down than step-up. Blue TB TKE added to HEP to prepare for further step activities. Introduced supported squats with emphasis on posterior weight shift and avoiding knees forward of toes - better tolerated with addition of green TB hip abduction isometric to increase glute recruitment. Wall squats also added with ball squeeze to increase VMO activation but pt only able to tolerate mini-squat depth due to difficulty rising from squat.    Comorbidities  GERD/gastroparesis, migraines    Rehab Potential Excellent    PT Frequency 2x / week    PT Duration 6 weeks  PT Treatment/Interventions ADLs/Self Care Home Management;Cryotherapy;Electrical Stimulation;Iontophoresis 4mg /ml Dexamethasone;Moist Heat;Ultrasound;Gait training;Stair training;Functional mobility training;Therapeutic activities;Therapeutic exercise;Balance training;Neuromuscular re-education;Patient/family education;Manual techniques;Passive range of motion;Dry needling;Taping;Joint Manipulations    PT Next Visit Plan gentle R knee ROM/stretching; LE strengthening to improve knee stability; gait training to improve gait pattern with increased weight shift to R LE; MT and modalities to address abnormal muscle tension/guarding, edema and pain (vaso not covered by )    PT Home Exercise Plan Access Code: [email or web address] (1/31, updated 2/9 & 2/13)    Consulted and Agree with Plan of Care Patient             Patient will benefit from skilled therapeutic intervention in order to improve the following deficits and impairments:  Abnormal gait, Decreased activity tolerance, Decreased balance, Decreased endurance, Decreased knowledge of precautions, Decreased mobility, Decreased range of motion, Decreased strength, Difficulty walking, Increased edema, Increased fascial restricitons, Increased muscle spasms, Impaired perceived functional ability, Impaired flexibility, Improper body mechanics, Postural dysfunction, Pain  Visit Diagnosis: Acute pain of right knee  Stiffness of right knee, not elsewhere classified  Muscle weakness (generalized)  Other abnormalities of gait and mobility  Difficulty in walking, not elsewhere classified  Other symptoms and signs involving the musculoskeletal system  Localized edema     Problem List There are no problems to display for this patient.   3/13, PT 01/11/2022, 12:19 PM  [facility] [address]  [address] [place], [place], [place] Phone: [phone]   Fax:  [phone]  Name: Kelly Finley MRN: Renaee Munda Date of Birth: September 09, 1981

## 2022-01-11 NOTE — Patient Instructions (Signed)
° ° ° °  Access Code: [email or web address] URL: [email or web address] Date: 01/11/2022 Prepared by: Glenetta Hew  Exercises Seated Hamstring Stretch with Strap - 2-3 x daily - 7 x weekly - 3 reps - 30 sec hold Supine ITB Stretch with Strap - 2-3 x daily - 7 x weekly - 3 reps - 30 sec hold Prone Quadriceps Stretch with Strap - 2-3 x daily - 7 x weekly - 3 reps - 30 sec hold Sitting Heel Slide with Towel - 2 x daily - 7 x weekly - 2 sets - 10 reps - 3 sec hold Supine Hip Adduction Isometric with Ball - 2 x daily - 7 x weekly - 2 sets - 10 reps - 3 sec hold Small Range Straight Leg Raise - 2 x daily - 7 x weekly - 2 sets - 10 reps Clamshell with Resistance - 2 x daily - 7 x weekly - 2 sets - 10 reps Standing Terminal Knee Extension with Resistance - 1 x daily - 7 x weekly - 2 sets - 10 reps - 5 sec hold Squat with Chair Touch and Resistance Loop - 1 x daily - 7 x weekly - 2 sets - 5-10 reps - 3-5 sec hold Wall Squat with Ball between Knees - 1 x daily - 7 x weekly - 2 sets - 5-10 reps - 5 sec hold

## 2022-01-13 ENCOUNTER — Ambulatory Visit: Payer: Managed Care, Other (non HMO)

## 2022-01-20 ENCOUNTER — Ambulatory Visit: Payer: Managed Care, Other (non HMO)

## 2022-01-20 ENCOUNTER — Other Ambulatory Visit: Payer: Self-pay

## 2022-01-20 DIAGNOSIS — R600 Localized edema: Secondary | ICD-10-CM

## 2022-01-20 DIAGNOSIS — R262 Difficulty in walking, not elsewhere classified: Secondary | ICD-10-CM

## 2022-01-20 DIAGNOSIS — M25561 Pain in right knee: Secondary | ICD-10-CM

## 2022-01-20 DIAGNOSIS — R29898 Other symptoms and signs involving the musculoskeletal system: Secondary | ICD-10-CM

## 2022-01-20 DIAGNOSIS — M25661 Stiffness of right knee, not elsewhere classified: Secondary | ICD-10-CM

## 2022-01-20 DIAGNOSIS — M6281 Muscle weakness (generalized): Secondary | ICD-10-CM

## 2022-01-20 DIAGNOSIS — R2689 Other abnormalities of gait and mobility: Secondary | ICD-10-CM

## 2022-01-20 NOTE — Therapy (Signed)
[facility] [address]  [place], [place], [address] Phone: [phone]   Fax:  [phone]  Physical Therapy Treatment  Patient Details  Name: Kelly Finley MRN: [id number] Date of Birth: Feb 26, 1981 Referring Provider (PT): Maggie Font, MD   Encounter Date: 01/20/2022   PT End of Session - 01/20/22 0859     Visit Number 4    Number of Visits 12    Date for PT Re-Evaluation 02/09/22    Authorization Type [employer] - VL: 29    PT Start Time 0805    PT Stop Time 0851    PT Time Calculation (min) 46 min    Activity Tolerance Patient tolerated treatment well    Behavior During Therapy [facility] for tasks assessed/performed             Past Medical History:  Diagnosis Date   GERD (gastroesophageal reflux disease)     Past Surgical History:  Procedure Laterality Date   FOOT SURGERY      There were no vitals filed for this visit.   Subjective Assessment - 01/20/22 0811     Subjective "I take tylenol as needed, walked all weekend w/o brace but sore after I did it."   Diagnostic tests 11/27/21 R knee x-ray: 1.  No acute fracture or malalignment.   2.  No joint effusion.   3.  Mild medial and patellofemoral compartment degenerative changes.    Patient Stated Goals "get back to 100% including running for work"    Currently in Pain? No/denies                               [facility] Adult PT Treatment/Exercise - 01/20/22 0001       Knee/Hip Exercises: Aerobic   Recumbent Bike L2 x 6 min      Knee/Hip Exercises: Machines for Strengthening   Cybex Knee Extension 10lb BLE 2x10    Cybex Knee Flexion 15lb BLE 2x10    Cybex Leg Press 15lb x 10 BLE      Knee/Hip Exercises: Standing   Lateral Step Up Right;10 reps;Hand Hold: 1;Step Height: 6"    Forward Step Up Right;10 reps;Hand Hold: 1;Step Height: 6"      Knee/Hip Exercises: Supine   Single Leg Bridge Strengthening;Both;5 reps   BLE on  concentric, single leg extension at top     Knee/Hip Exercises: Sidelying   Hip ABduction AROM;Strengthening;Right;10 reps    Hip ADduction AROM;Strengthening;Right;10 reps                     PT Education - 01/20/22 0859     Education Details Edu on gym equipment, she was told to avoid leg press for now, could use leg ext but avoiding TKE and leg curl machine with low load    Person(s) Educated Patient    Methods Explanation;Verbal cues    Comprehension Verbalized understanding;Verbal cues required;Need further instruction              PT Short Term Goals - 01/20/22 0817       PT SHORT TERM GOAL #1   Title Patient will be independent with initial HEP    Status Achieved   01/20/22   Target Date 01/19/22      PT SHORT TERM GOAL #2   Title Patient to report reduction in frequency and intensity of R knee pain by >/= 25-50%  to allow for improved activity tolerance    Status Achieved   01/20/22   Target Date 01/19/22               PT Long Term Goals - 01/07/22 1118       PT LONG TERM GOAL #1   Title Patient will be independent with ongoing/advanced HEP for self-management at home    Status On-going    Target Date 02/09/22      PT LONG TERM GOAL #2   Title Patient to improve R knee AROM >/= 0-130 without pain provocation    Status On-going    Target Date 02/09/22      PT LONG TERM GOAL #3   Title Patient will demonstrate improved R LE strength to >/= 4+/5 for improved stability and ease of mobility    Status On-going    Target Date 02/09/22      PT LONG TERM GOAL #4   Title Patient will be able to ambulate with normal mechanics over level and uneven terrain without limitation due to R knee pain    Status On-going    Target Date 02/09/22      PT LONG TERM GOAL #5   Title Patient will negotiate stairs reciprocally with normal step pattern w/o limitation due to R knee pain or weakness    Status On-going    Target Date 02/09/22      PT LONG TERM  GOAL #6   Title Patient to report ability to perform ADLs, household, and work-related tasks including running without limitation due to R knee pain, LOM or weakness    Status On-going    Target Date 02/09/22                   Plan - 01/20/22 0901     Clinical Impression Statement Pt had no concerns with initial HEP today, along with noting 50% reduction in knee pain with daily activities - both STGs are met. She requested to review gym machines for the lower body, she had difficulty with the leg press, leg ext and leg curls worked fine but I advised her to avoid TKE with leg ext. I advised her to refrain from leg press machine as of now until squatting motions are able to be done w/o knee pain. All other exercises fairly tolerated with fatigue noted during the single leg bridges.    Personal Factors and Comorbidities Comorbidity 2;Past/Current Experience;Time since onset of injury/illness/exacerbation;Profession    Comorbidities GERD/gastroparesis, migraines    PT Frequency 2x / week    PT Duration 6 weeks    PT Treatment/Interventions ADLs/Self Care Home Management;Cryotherapy;Electrical Stimulation;Iontophoresis 67m/ml Dexamethasone;Moist Heat;Ultrasound;Gait training;Stair training;Functional mobility training;Therapeutic activities;Therapeutic exercise;Balance training;Neuromuscular re-education;Patient/family education;Manual techniques;Passive range of motion;Dry needling;Taping;Joint Manipulations    PT Next Visit Plan gentle R knee ROM/stretching; LE strengthening to improve knee stability; gait training to improve gait pattern with increased weight shift to R LE; MT and modalities to address abnormal muscle tension/guarding, edema and pain (vaso not covered by [facility]    PT Home Exercise Plan Access Code: [email or web address] (1/31, updated 2/9 & 2/13)    Consulted and Agree with Plan of Care Patient             Patient will benefit from skilled therapeutic intervention in order to  improve the following deficits and impairments:  Abnormal gait, Decreased activity tolerance, Decreased balance, Decreased endurance, Decreased knowledge of precautions, Decreased mobility, Decreased range of motion, Decreased strength, Difficulty walking, Increased edema,  Increased fascial restricitons, Increased muscle spasms, Impaired perceived functional ability, Impaired flexibility, Improper body mechanics, Postural dysfunction, Pain  Visit Diagnosis: Acute pain of right knee  Stiffness of right knee, not elsewhere classified  Muscle weakness (generalized)  Other abnormalities of gait and mobility  Difficulty in walking, not elsewhere classified  Other symptoms and signs involving the musculoskeletal system  Localized edema     Problem List There are no problems to display for this patient.   Artist Pais, PTA 01/20/2022, 9:08 AM  [facility] [address]  [place], [place], [address] Phone: [phone]   Fax:  [phone]  Name: EXA BOMBA MRN: [id number] Date of Birth: August 05, 1981

## 2022-01-21 ENCOUNTER — Ambulatory Visit: Payer: Managed Care, Other (non HMO)

## 2022-01-21 DIAGNOSIS — M25661 Stiffness of right knee, not elsewhere classified: Secondary | ICD-10-CM

## 2022-01-21 DIAGNOSIS — R2689 Other abnormalities of gait and mobility: Secondary | ICD-10-CM

## 2022-01-21 DIAGNOSIS — M6281 Muscle weakness (generalized): Secondary | ICD-10-CM

## 2022-01-21 DIAGNOSIS — R600 Localized edema: Secondary | ICD-10-CM

## 2022-01-21 DIAGNOSIS — M25561 Pain in right knee: Secondary | ICD-10-CM | POA: Diagnosis not present

## 2022-01-21 DIAGNOSIS — R29898 Other symptoms and signs involving the musculoskeletal system: Secondary | ICD-10-CM

## 2022-01-21 DIAGNOSIS — R262 Difficulty in walking, not elsewhere classified: Secondary | ICD-10-CM

## 2022-01-21 NOTE — Therapy (Signed)
[facility] [address]  [address] [place], [place], [address] Phone: [phone]   Fax:  [phone]  Physical Therapy Treatment  Patient Details  Name: Kelly Finley MRN: [id number] Date of Birth: 08/04/1981 Referring Provider (PT): Norberta Keens, MD   Encounter Date: 01/21/2022   PT End of Session - 01/21/22 1707     Visit Number 5    Number of Visits 12    Date for PT Re-Evaluation 02/09/22    Authorization Type [employer] - VL: 60    PT Start Time 1625   pt late   PT Stop Time 1700    PT Time Calculation (min) 35 min    Activity Tolerance Patient tolerated treatment well    Behavior During Therapy WFL for tasks assessed/performed             Past Medical History:  Diagnosis Date   GERD (gastroesophageal reflux disease)     Past Surgical History:  Procedure Laterality Date   FOOT SURGERY      There were no vitals filed for this visit.   Subjective Assessment - 01/21/22 1626     Subjective Pt reports being on the floor all day, reporting some throbbing pain in her knee as she has not sat down much today.    Diagnostic tests 11/27/21 R knee x-ray: 1.  No acute fracture or malalignment.   2.  No joint effusion.   3.  Mild medial and patellofemoral compartment degenerative changes.    Patient Stated Goals "get back to 100% including running for work"    Currently in Pain? Yes    Pain Score 4     Pain Location Knee    Pain Orientation Right;Medial    Pain Descriptors / Indicators Throbbing    Pain Type Acute pain                               [facility] Adult PT Treatment/Exercise - 01/21/22 0001       Knee/Hip Exercises: Aerobic   Recumbent Bike L2 x 4 min      Knee/Hip Exercises: Supine   Bridges with Clamshell Strengthening;Both;2 sets;10 reps   green TB; second set with arms crossed     Knee/Hip Exercises: Sidelying   Clams R clamshells with green TB 2x10      Knee/Hip  Exercises: Prone   Hamstring Curl 10 reps    Hamstring Curl Limitations GTB    Hip Extension Strengthening;Right;2 sets;10 reps    Hip Extension Limitations green TB at knees      Manual Therapy   Manual Therapy Soft tissue mobilization;Myofascial release    Soft tissue mobilization IASTM to R quads, adductors, medial hamstrings   very tender along the knee attachment for adductors and medial HS                      PT Short Term Goals - 01/20/22 0817       PT SHORT TERM GOAL #1   Title Patient will be independent with initial HEP    Status Achieved   01/20/22   Target Date 01/19/22      PT SHORT TERM GOAL #2   Title Patient to report reduction in frequency and intensity of R knee pain by >/= 25-50% to allow for improved activity tolerance    Status Achieved   01/20/22   Target Date 01/19/22  PT Long Term Goals - 01/07/22 1118       PT LONG TERM GOAL #1   Title Patient will be independent with ongoing/advanced HEP for self-management at home    Status On-going    Target Date 02/09/22      PT LONG TERM GOAL #2   Title Patient to improve R knee AROM >/= 0-130 without pain provocation    Status On-going    Target Date 02/09/22      PT LONG TERM GOAL #3   Title Patient will demonstrate improved R LE strength to >/= 4+/5 for improved stability and ease of mobility    Status On-going    Target Date 02/09/22      PT LONG TERM GOAL #4   Title Patient will be able to ambulate with normal mechanics over level and uneven terrain without limitation due to R knee pain    Status On-going    Target Date 02/09/22      PT LONG TERM GOAL #5   Title Patient will negotiate stairs reciprocally with normal step pattern w/o limitation due to R knee pain or weakness    Status On-going    Target Date 02/09/22      PT LONG TERM GOAL #6   Title Patient to report ability to perform ADLs, household, and work-related tasks including running without limitation  due to R knee pain, LOM or weakness    Status On-going    Target Date 02/09/22                   Plan - 01/21/22 1708     Clinical Impression Statement Pt arrived late today, so the session was shortened. She c/o increased knee pain from being on her feet at work. She was tender along the R medial knee jt line along with the medial hamstrings and adductors. She was educated on using a muscle roller after exercises or long days of work to improve muscular recovery. We were able to progress TE with more resistance and no reports of increased pain, however noted some fatigue from weight bearing at work all day. Pt will need MD progress note next visit.    Personal Factors and Comorbidities Comorbidity 2;Past/Current Experience;Time since onset of injury/illness/exacerbation;Profession    PT Frequency 2x / week    PT Duration 6 weeks    PT Treatment/Interventions ADLs/Self Care Home Management;Cryotherapy;Electrical Stimulation;Iontophoresis 4mg /ml Dexamethasone;Moist Heat;Ultrasound;Gait training;Stair training;Functional mobility training;Therapeutic activities;Therapeutic exercise;Balance training;Neuromuscular re-education;Patient/family education;Manual techniques;Passive range of motion;Dry needling;Taping;Joint Manipulations    PT Next Visit Plan gentle R knee ROM/stretching; LE strengthening to improve knee stability; gait training to improve gait pattern with increased weight shift to R LE; MT and modalities to address abnormal muscle tension/guarding, edema and pain (vaso not covered by )    PT Home Exercise Plan Access Code: [email or web address] (1/31, updated 2/9 & 2/13)    Consulted and Agree with Plan of Care Patient             Patient will benefit from skilled therapeutic intervention in order to improve the following deficits and impairments:  Abnormal gait, Decreased activity tolerance, Decreased balance, Decreased endurance, Decreased knowledge of precautions, Decreased  mobility, Decreased range of motion, Decreased strength, Difficulty walking, Increased edema, Increased fascial restricitons, Increased muscle spasms, Impaired perceived functional ability, Impaired flexibility, Improper body mechanics, Postural dysfunction, Pain  Visit Diagnosis: Acute pain of right knee  Stiffness of right knee, not elsewhere classified  Muscle weakness (generalized)  Other abnormalities of  gait and mobility  Difficulty in walking, not elsewhere classified  Other symptoms and signs involving the musculoskeletal system  Localized edema     Problem List There are no problems to display for this patient.   Darleene Cleaver, PTA 01/21/2022, 6:01 PM  [facility] [address]  [address] [place], [place], [address] Phone: [phone]   Fax:  [phone]  Name: Kelly Finley MRN: [id number] Date of Birth: [birth year]

## 2022-01-26 ENCOUNTER — Other Ambulatory Visit: Payer: Self-pay

## 2022-01-26 ENCOUNTER — Ambulatory Visit: Payer: Managed Care, Other (non HMO) | Admitting: Physical Therapy

## 2022-01-26 ENCOUNTER — Encounter: Payer: Self-pay | Admitting: Physical Therapy

## 2022-01-26 DIAGNOSIS — M25661 Stiffness of right knee, not elsewhere classified: Secondary | ICD-10-CM

## 2022-01-26 DIAGNOSIS — R29898 Other symptoms and signs involving the musculoskeletal system: Secondary | ICD-10-CM

## 2022-01-26 DIAGNOSIS — M25561 Pain in right knee: Secondary | ICD-10-CM

## 2022-01-26 DIAGNOSIS — M6281 Muscle weakness (generalized): Secondary | ICD-10-CM

## 2022-01-26 DIAGNOSIS — R262 Difficulty in walking, not elsewhere classified: Secondary | ICD-10-CM

## 2022-01-26 DIAGNOSIS — R2689 Other abnormalities of gait and mobility: Secondary | ICD-10-CM

## 2022-01-26 DIAGNOSIS — R600 Localized edema: Secondary | ICD-10-CM

## 2022-01-26 NOTE — Therapy (Signed)
[facility] [address]  [address] [place], [place], [address] Phone: [phone]   Fax:  [phone]  Physical Therapy Treatment  Patient Details  Name: Kelly Finley MRN: [id number] Date of Birth: 24-Nov-1981 Referring Provider (PT): Norberta Keens, MD   Encounter Date: 01/26/2022   PT End of Session - 01/26/22 1302     Visit Number 6    Number of Visits 12    Date for PT Re-Evaluation 02/09/22    Authorization Type [employer] - VL: 60    PT Start Time 1302    PT Stop Time 1347    PT Time Calculation (min) 45 min    Activity Tolerance Patient tolerated treatment well    Behavior During Therapy [facility] for tasks assessed/performed             Past Medical History:  Diagnosis Date   GERD (gastroesophageal reflux disease)     Past Surgical History:  Procedure Laterality Date   FOOT SURGERY      There were no vitals filed for this visit.   Subjective Assessment - 01/26/22 1302     Subjective stiff in the morning first thing or if she's been resting. MD wants her to continue PT. No pain unless she's been on it all day.    How long can you stand comfortably? not limited    Patient Stated Goals "get back to 100% including running for work"    Currently in Pain? No/denies                               [facility] Adult PT Treatment/Exercise - 01/26/22 0001       Knee/Hip Exercises: Aerobic   Recumbent Bike L4 x 6 min      Knee/Hip Exercises: Machines for Strengthening   Cybex Knee Extension 5# x 10 RLE; 15# BLE x 10    Cybex Knee Flexion 20 lb BLE 2x10      Knee/Hip Exercises: Standing   Forward Step Up Right;10 reps;Step Height: 4";Step Height: 6"    Forward Step Up Limitations with left hip flexion    Step Down 10 reps;Hand Hold: 1;Step Height: 4"    Step Down Limitations heel taps with left; attempted 6 inch but significant shaking    Functional Squat 10 reps    Functional Squat Limitations  blue pad in chair      Knee/Hip Exercises: Supine   Short Arc Quad Sets Right;10 reps    Short Arc Quad Sets Limitations 5# over blue bolster                     PT Education - 01/26/22 1558     Education Details SAQ and heel taps    Person(s) Educated Patient    Methods Explanation;Demonstration;Handout;Verbal cues    Comprehension Verbalized understanding;Returned demonstration              PT Short Term Goals - 01/20/22 0817       PT SHORT TERM GOAL #1   Title Patient will be independent with initial HEP    Status Achieved   01/20/22   Target Date 01/19/22      PT SHORT TERM GOAL #2   Title Patient to report reduction in frequency and intensity of R knee pain by >/= 25-50% to allow for improved activity tolerance    Status Achieved   01/20/22  Target Date 01/19/22               PT Long Term Goals - 01/07/22 1118       PT LONG TERM GOAL #1   Title Patient will be independent with ongoing/advanced HEP for self-management at home    Status On-going    Target Date 02/09/22      PT LONG TERM GOAL #2   Title Patient to improve R knee AROM >/= 0-130 without pain provocation    Status On-going    Target Date 02/09/22      PT LONG TERM GOAL #3   Title Patient will demonstrate improved R LE strength to >/= 4+/5 for improved stability and ease of mobility    Status On-going    Target Date 02/09/22      PT LONG TERM GOAL #4   Title Patient will be able to ambulate with normal mechanics over level and uneven terrain without limitation due to R knee pain    Status On-going    Target Date 02/09/22      PT LONG TERM GOAL #5   Title Patient will negotiate stairs reciprocally with normal step pattern w/o limitation due to R knee pain or weakness    Status On-going    Target Date 02/09/22      PT LONG TERM GOAL #6   Title Patient to report ability to perform ADLs, household, and work-related tasks including running without limitation due to R knee  pain, LOM or weakness    Status On-going    Target Date 02/09/22                   Plan - 01/26/22 1554     Clinical Impression Statement Carprice continues to make progress. She reports only having pain with stairs, squats and running now. She demos eccentric quad weakness with heel taps,  functional squats and SAQs. She is able to tolerate these exercises without increased pain (squat was to raised surface). Advised to use stationary bike at home on days off if knee feels stiff.    PT Frequency 2x / week    PT Duration 6 weeks    PT Treatment/Interventions ADLs/Self Care Home Management;Cryotherapy;Electrical Stimulation;Iontophoresis 4mg /ml Dexamethasone;Moist Heat;Ultrasound;Gait training;Stair training;Functional mobility training;Therapeutic activities;Therapeutic exercise;Balance training;Neuromuscular re-education;Patient/family education;Manual techniques;Passive range of motion;Dry needling;Taping;Joint Manipulations    PT Next Visit Plan gentle R knee ROM/stretching; LE strengthening to improve knee stability; gait training to improve gait pattern with increased weight shift to R LE; MT and modalities to address abnormal muscle tension/guarding, edema and pain (vaso not covered by )    Consulted and Agree with Plan of Care Patient             Patient will benefit from skilled therapeutic intervention in order to improve the following deficits and impairments:  Abnormal gait, Decreased activity tolerance, Decreased balance, Decreased endurance, Decreased knowledge of precautions, Decreased mobility, Decreased range of motion, Decreased strength, Difficulty walking, Increased edema, Increased fascial restricitons, Increased muscle spasms, Impaired perceived functional ability, Impaired flexibility, Improper body mechanics, Postural dysfunction, Pain  Visit Diagnosis: Acute pain of right knee  Stiffness of right knee, not elsewhere classified  Muscle weakness  (generalized)  Other abnormalities of gait and mobility  Difficulty in walking, not elsewhere classified  Other symptoms and signs involving the musculoskeletal system  Localized edema     Problem List There are no problems to display for this patient.  Rosann Auerbach, PT 01/26/2022, 3:58 PM  [facility] [address]  [address] [place], [place], [address] Phone: [phone]   Fax:  [phone]  Name: Kelly Finley MRN: [id number] Date of Birth: [birth year]

## 2022-01-26 NOTE — Patient Instructions (Signed)
Access Code: [email or web address] URL: [email or web address] Date: 01/26/2022 Prepared by: Raynelle Fanning  Exercises Seated Hamstring Stretch with Strap - 2-3 x daily - 7 x weekly - 3 reps - 30 sec hold Supine ITB Stretch with Strap - 2-3 x daily - 7 x weekly - 3 reps - 30 sec hold Prone Quadriceps Stretch with Strap - 2-3 x daily - 7 x weekly - 3 reps - 30 sec hold Sitting Heel Slide with Towel - 2 x daily - 7 x weekly - 2 sets - 10 reps - 3 sec hold Supine Hip Adduction Isometric with Ball - 2 x daily - 7 x weekly - 2 sets - 10 reps - 3 sec hold Small Range Straight Leg Raise - 2 x daily - 7 x weekly - 2 sets - 10 reps Clamshell with Resistance - 2 x daily - 7 x weekly - 2 sets - 10 reps Standing Terminal Knee Extension with Resistance - 1 x daily - 7 x weekly - 2 sets - 10 reps - 5 sec hold Squat with Chair Touch and Resistance Loop - 1 x daily - 7 x weekly - 2 sets - 5-10 reps - 3-5 sec hold Wall Squat with Ball between Knees - 1 x daily - 7 x weekly - 2 sets - 5-10 reps - 5 sec hold Lateral Step Down (Mirrored) - 1 x daily - 7 x weekly - 2 sets - 10 reps Short Arc Quad with Ankle Weight (Mirrored) - 1 x daily - 7 x weekly - 3 sets - 10 reps

## 2022-01-29 ENCOUNTER — Ambulatory Visit: Payer: Managed Care, Other (non HMO) | Attending: Family Medicine

## 2022-01-29 ENCOUNTER — Other Ambulatory Visit: Payer: Self-pay

## 2022-01-29 DIAGNOSIS — M25661 Stiffness of right knee, not elsewhere classified: Secondary | ICD-10-CM | POA: Diagnosis present

## 2022-01-29 DIAGNOSIS — R29898 Other symptoms and signs involving the musculoskeletal system: Secondary | ICD-10-CM | POA: Insufficient documentation

## 2022-01-29 DIAGNOSIS — R262 Difficulty in walking, not elsewhere classified: Secondary | ICD-10-CM | POA: Diagnosis present

## 2022-01-29 DIAGNOSIS — R2689 Other abnormalities of gait and mobility: Secondary | ICD-10-CM | POA: Diagnosis present

## 2022-01-29 DIAGNOSIS — M25561 Pain in right knee: Secondary | ICD-10-CM | POA: Diagnosis present

## 2022-01-29 DIAGNOSIS — R600 Localized edema: Secondary | ICD-10-CM | POA: Diagnosis present

## 2022-01-29 DIAGNOSIS — M6281 Muscle weakness (generalized): Secondary | ICD-10-CM | POA: Diagnosis present

## 2022-01-29 NOTE — Therapy (Signed)
[facility] ?[address]  [address] ?[place], [place], [address] ?Phone: [phone]   Fax:  [phone] ? ?Physical Therapy Treatment ? ?Patient Details  ?Name: Kelly Finley ?MRN: [id number] ?Date of Birth: July 28, 1981 ?Referring Provider (PT): Norberta Keens, MD ? ? ?Encounter Date: 01/29/2022 ? ? PT End of Session - 01/29/22 1158   ? ? Visit Number 7   ? Number of Visits 12   ? Date for PT Re-Evaluation 02/09/22   ? Authorization Type [employer] - VL: 60   ? PT Start Time 1104   ? PT Stop Time 1151   ? PT Time Calculation (min) 47 min   ? Activity Tolerance Patient tolerated treatment well   ? Behavior During Therapy [facility] for tasks assessed/performed   ? ?  ?  ? ?  ? ? ?Past Medical History:  ?Diagnosis Date  ? GERD (gastroesophageal reflux disease)   ? ? ?Past Surgical History:  ?Procedure Laterality Date  ? FOOT SURGERY    ? ? ?There were no vitals filed for this visit. ? ? Subjective Assessment - 01/29/22 1105   ? ? Subjective "Worked from 7am to 12am, so I am sore and tired today."   ? Diagnostic tests 11/27/21 R knee x-ray: 1.  No acute fracture or malalignment.   2.  No joint effusion.   3.  Mild medial and patellofemoral compartment degenerative changes.   ? Patient Stated Goals "get back to 100% including running for work"   ? Currently in Pain? No/denies   ? ?  ?  ? ?  ? ? ? ? ? [facility] PT Assessment - 01/29/22 0001   ? ?  ? AROM  ? Right Knee Extension -9   ? Right Knee Flexion 118   ? ?  ?  ? ?  ? ? ? ? ? ? ? ? ? ? ? ? ? ? ? ? [facility] Adult PT Treatment/Exercise - 01/29/22 0001   ? ?  ? Knee/Hip Exercises: Aerobic  ? Recumbent Bike L3 x 6 min   ?  ? Knee/Hip Exercises: Standing  ? Step Down Right;10 reps;Step Height: 6"   ? Step Down Limitations backward + lateral step down   ? Other Standing Knee Exercises retro step with arm raise 2x10   cues to avoid R knee hyperextension  ? Other Standing Knee Exercises hip hiking on step 2x10; monster walks fwd GTB 2x75ft    ?  ? Knee/Hip Exercises: Seated  ? Long [employer] Strengthening;Right;10 reps (GTB)  ?  ? Modalities  ? Modalities Iontophoresis   ?  ? Iontophoresis  ? Type of Iontophoresis Dexamethasone   ? Location R medial knee   ? Dose 80 mA/min   ? Time 4 hour patch   ? ?  ?  ? ?  ? ? ? ? ? ? ? ? ? ? ? ? PT Short Term Goals - 01/20/22 0817   ? ?  ? PT SHORT TERM GOAL #1  ? Title Patient will be independent with initial HEP   ? Status Achieved   01/20/22  ? Target Date 01/19/22   ?  ? PT SHORT TERM GOAL #2  ? Title Patient to report reduction in frequency and intensity of R knee pain by >/= 25-50% to allow for improved activity tolerance   ? Status Achieved   01/20/22  ? Target Date 01/19/22   ? ?  ?  ? ?  ? ? ? ?  PT Long Term Goals - 01/07/22 1118   ? ?  ? PT LONG TERM GOAL #1  ? Title Patient will be independent with ongoing/advanced HEP for self-management at home   ? Status On-going   ? Target Date 02/09/22   ?  ? PT LONG TERM GOAL #2  ? Title Patient to improve R knee AROM >/= 0-130? without pain provocation   ? Status On-going   ? Target Date 02/09/22   ?  ? PT LONG TERM GOAL #3  ? Title Patient will demonstrate improved R LE strength to >/= 4+/5 for improved stability and ease of mobility   ? Status On-going   ? Target Date 02/09/22   ?  ? PT LONG TERM GOAL #4  ? Title Patient will be able to ambulate with normal mechanics over level and uneven terrain without limitation due to R knee pain   ? Status On-going   ? Target Date 02/09/22   ?  ? PT LONG TERM GOAL #5  ? Title Patient will negotiate stairs reciprocally with normal step pattern w/o limitation due to R knee pain or weakness   ? Status On-going   ? Target Date 02/09/22   ?  ? PT LONG TERM GOAL #6  ? Title Patient to report ability to perform ADLs, household, and work-related tasks including running without limitation due to R knee pain, LOM or weakness   ? Status On-going   ? Target Date 02/09/22   ? ?  ?  ? ?  ? ? ? ? ? ? ? ? Plan - 01/29/22 1159   ? ? Clinical  Impression Statement Pt continues being able to progress with WB TE with no increased pain. At times still requiring cues to increase post. WS with squats and eccentric step downs to reduce stress on knee. She did show more fatigue today with the exercises especially monster walks. Due to her continued reports of medial knee pain with long shifts at work, we tried an [employer] post session today to the R medial knee, explained the application and process to her before application.   ? Personal Factors and Comorbidities Comorbidity 2;Past/Current Experience;Time since onset of injury/illness/exacerbation;Profession   ? Comorbidities GERD/gastroparesis, migraines   ? PT Frequency 2x / week   ? PT Duration 6 weeks   ? PT Treatment/Interventions ADLs/Self Care Home Management;Cryotherapy;Electrical Stimulation;Iontophoresis 4mg /ml Dexamethasone;Moist Heat;Ultrasound;Gait training;Stair training;Functional mobility training;Therapeutic activities;Therapeutic exercise;Balance training;Neuromuscular re-education;Patient/family education;Manual techniques;Passive range of motion;Dry needling;Taping;Joint Manipulations   ? PT Next Visit Plan aasess response to ionto; LE strengthening to improve knee stability; gait training to improve gait pattern with increased weight shift to R LE; MT and modalities to address abnormal muscle tension/guarding, edema and pain (vaso not covered by )   ? PT Home Exercise Plan Access Code: [email or web address] (1/31, updated 2/9 & 2/13)   ? Consulted and Agree with Plan of Care Patient   ? ?  ?  ? ?  ? ? ?Patient will benefit from skilled therapeutic intervention in order to improve the following deficits and impairments:  Abnormal gait, Decreased activity tolerance, Decreased balance, Decreased endurance, Decreased knowledge of precautions, Decreased mobility, Decreased range of motion, Decreased strength, Difficulty walking, Increased edema, Increased fascial restricitons, Increased muscle  spasms, Impaired perceived functional ability, Impaired flexibility, Improper body mechanics, Postural dysfunction, Pain ? ?Visit Diagnosis: ?Acute pain of right knee ? ?Stiffness of right knee, not elsewhere classified ? ?Muscle weakness (generalized) ? ?Other abnormalities of gait and mobility ? ?  Difficulty in walking, not elsewhere classified ? ?Other symptoms and signs involving the musculoskeletal system ? ?Localized edema ? ? ? ? ?Problem List ?There are no problems to display for this patient. ? ? ?Darleene Cleaver, PTA ?01/29/2022, 12:11 PM ? ?[facility] ?[address]  [address] ?[place], [place], [address] ?Phone: [phone]   Fax:  [phone] ? ?Name: ELORA WOLTER ?MRN: [id number] ?Date of Birth: [birth year] ? ? ? ?

## 2022-02-03 ENCOUNTER — Other Ambulatory Visit: Payer: Self-pay

## 2022-02-03 ENCOUNTER — Ambulatory Visit: Payer: Managed Care, Other (non HMO)

## 2022-02-03 DIAGNOSIS — M6281 Muscle weakness (generalized): Secondary | ICD-10-CM

## 2022-02-03 DIAGNOSIS — R2689 Other abnormalities of gait and mobility: Secondary | ICD-10-CM

## 2022-02-03 DIAGNOSIS — M25661 Stiffness of right knee, not elsewhere classified: Secondary | ICD-10-CM

## 2022-02-03 DIAGNOSIS — R29898 Other symptoms and signs involving the musculoskeletal system: Secondary | ICD-10-CM

## 2022-02-03 DIAGNOSIS — M25561 Pain in right knee: Secondary | ICD-10-CM | POA: Diagnosis not present

## 2022-02-03 DIAGNOSIS — R262 Difficulty in walking, not elsewhere classified: Secondary | ICD-10-CM

## 2022-02-03 NOTE — Therapy (Signed)
?[facility] ?[address]  [address] ?[place], [place], [address] ?Phone: [phone]   Fax:  [phone] ? ?Physical Therapy Treatment ? ?Patient Details  ?Name: Kelly Finley ?MRN: [id number] ?Date of Birth: 08/29/1981 ?Referring Provider (PT): Norberta KeensJonathan Yoder, MD ? ? ?Encounter Date: 02/03/2022 ? ? PT End of Session - 02/03/22 1148   ? ? Visit Number 8   ? Number of Visits 12   ? Date for PT Re-Evaluation 02/09/22   ? Authorization Type Cign[employer] - VL: 60   ? PT Start Time 1104   ? PT Stop Time 1148   ? PT Time Calculation (min) 44 min   ? Activity Tolerance Patient tolerated treatment well   ? Behavior [facility] Therapy [facility] for tasks assessed/performed   ? ?  ?  ? ?  ? ? ?Past Medical History:  ?Diagnosis Date  ? GERD (gastroesophageal reflux disease)   ? ? ?Past Surgical History:  ?Procedure Laterality Date  ? FOOT SURGERY    ? ? ?There were no vitals filed for this visit. ? ? Subjective Assessment - 02/03/22 1105   ? ? Subjective No pain today, the patch helped a little.   ? How long can you walk comfortably? 4,000 steps per day in brace - normal workday pre-injury could be up to 18-20,000 steps   ? Diagnostic tests 11/27/21 R knee x-ray: 1.  No acute fracture or malalignment.   2.  No joint effusion.   3.  Mild medial and patellofemoral compartment degenerative changes.   ? Patient Stated Goals "get back to 100% including running for work"   ? Currently in Pain? No/denies   ? ?  ?  ? ?  ? ? ? ? ? ? ? ? ? ? ? ? ? ? ? ? ? ? ? ? [facility] Adult PT Treatment/Exercise - 02/03/22 0001   ? ?  ? Knee/Hip Exercises: Aerobic  ? Elliptical L1.0 5 min   ?  ? Knee/Hip Exercises: Machines for Strengthening  ? Cybex Knee Extension 5# 2 x 10 RLE; 20# BLE x 10   ? Cybex Leg Press 20lb 2 x 10 BLE, 10lb 2 x 10 RLE   ?  ? Knee/Hip Exercises: Standing  ? Terminal Knee Extension Strengthening;Right;20 reps;Theraband   ? Theraband Level (Terminal Knee Extension) Level 4 (Blue)   ? Step Down  Right;10 reps;Step Height: 6";2 sets   ? Step Down Limitations backward + fwd step down   ? Other Standing Knee Exercises monster walks fwd/retro GTB 4x5225ft   ? ?  ?  ? ?  ? ? ? ? ? ? ? ? ? ? ? ? PT Short Term Goals - 01/20/22 0817   ? ?  ? PT SHORT TERM GOAL #1  ? Title Patient will be independent with initial HEP   ? Status Achieved   01/20/22  ? Target Date 01/19/22   ?  ? PT SHORT TERM GOAL #2  ? Title Patient to report reduction in frequency and intensity of R knee pain by >/= 25-50% to allow for improved activity tolerance   ? Status Achieved   01/20/22  ? Target Date 01/19/22   ? ?  ?  ? ?  ? ? ? ? PT Long Term Goals - 01/07/22 1118   ? ?  ? PT LONG TERM GOAL #1  ? Title Patient will be independent with ongoing/advanced HEP for self-management at home   ? Status  On-going   ? Target Date 02/09/22   ?  ? PT LONG TERM GOAL #2  ? Title Patient to improve R knee AROM >/= 0-130? without pain provocation   ? Status On-going   ? Target Date 02/09/22   ?  ? PT LONG TERM GOAL #3  ? Title Patient will demonstrate improved R LE strength to >/= 4+/5 for improved stability and ease of mobility   ? Status On-going   ? Target Date 02/09/22   ?  ? PT LONG TERM GOAL #4  ? Title Patient will be able to ambulate with normal mechanics over level and uneven terrain without limitation due to R knee pain   ? Status On-going   ? Target Date 02/09/22   ?  ? PT LONG TERM GOAL #5  ? Title Patient will negotiate stairs reciprocally with normal step pattern w/o limitation due to R knee pain or weakness   ? Status On-going   ? Target Date 02/09/22   ?  ? PT LONG TERM GOAL #6  ? Title Patient to report ability to perform ADLs, household, and work-related tasks including running without limitation due to R knee pain, LOM or weakness   ? Status On-going   ? Target Date 02/09/22   ? ?  ?  ? ?  ? ? ? ? ? ? ? ? Plan - 02/03/22 1149   ? ? Clinical Impression Statement Pt noted some improvement from the ionto patch. Shifted focus on fwd eccentric  step down today to help with stair navigation. Pt does become fatigued in the R knee quickly with low intensity drill exercises like monster walks. Focused on glute strengthening with leg press and VMO strength on leg extension. She still notes soreness and difficulty completing a full shift of work w/o having a lot of soreness afterward, so will need more work on [employer] strengthening and proximal stability.   ? Personal Factors and Comorbidities Comorbidity 2;Past/Current Experience;Time since onset of injury/illness/exacerbation;Profession   ? Comorbidities GERD/gastroparesis, migraines   ? PT Frequency 2x / week   ? PT Duration 6 weeks   ? PT Treatment/Interventions ADLs/Self Care Home Management;Cryotherapy;Electrical Stimulation;Iontophoresis 4mg /ml Dexamethasone;Moist Heat;Ultrasound;Gait training;Stair training;Functional mobility training;Therapeutic activities;Therapeutic exercise;Balance training;Neuromuscular re-education;Patient/family education;Manual techniques;Passive range of motion;Dry needling;Taping;Joint Manipulations   ? PT Next Visit Plan R LE step downs, glute strengthening, VMO strength; MT and modalities to address abnormal muscle tension/guarding, edema and pain (vaso not covered by [place])   ? PT Home Exercise Plan Access Code: [email or web address] (1/31, updated 2/9 & 2/13)   ? Consulted and Agree with Plan of Care Patient   ? ?  ?  ? ?  ? ? ?Patient will benefit from skilled therapeutic intervention in order to improve the following deficits and impairments:  Abnormal gait, Decreased activity tolerance, Decreased balance, Decreased endurance, Decreased knowledge of precautions, Decreased mobility, Decreased range of motion, Decreased strength, Difficulty walking, Increased edema, Increased fascial restricitons, Increased muscle spasms, Impaired perceived functional ability, Impaired flexibility, Improper body mechanics, Postural dysfunction, Pain ? ?Visit Diagnosis: ?Acute pain of right knee ? ?Stiffness  of right knee, not elsewhere classified ? ?Muscle weakness (generalized) ? ?Other abnormalities of gait and mobility ? ?Difficulty in walking, not elsewhere classified ? ?Other symptoms and signs involving the musculoskeletal system ? ? ? ? ?Problem List ?There are no problems to display for this patient. ? ? ?Artist Pais, PTA ?02/03/2022, 12:08 PM ? ?[place] ?[facility] ?[address]  [address] ?[place], [place], [address] ?Phone: [phone]   Fax:  [phone] ? ?Name: Kelly Finley ?MRN: [id number] ?Date of Birth: [birth year] ? ? ? ?

## 2022-02-04 ENCOUNTER — Ambulatory Visit: Payer: Managed Care, Other (non HMO)

## 2022-02-08 ENCOUNTER — Ambulatory Visit: Payer: Managed Care, Other (non HMO)

## 2022-02-08 ENCOUNTER — Other Ambulatory Visit: Payer: Self-pay

## 2022-02-08 DIAGNOSIS — R2689 Other abnormalities of gait and mobility: Secondary | ICD-10-CM

## 2022-02-08 DIAGNOSIS — R600 Localized edema: Secondary | ICD-10-CM

## 2022-02-08 DIAGNOSIS — M6281 Muscle weakness (generalized): Secondary | ICD-10-CM

## 2022-02-08 DIAGNOSIS — M25661 Stiffness of right knee, not elsewhere classified: Secondary | ICD-10-CM

## 2022-02-08 DIAGNOSIS — R262 Difficulty in walking, not elsewhere classified: Secondary | ICD-10-CM

## 2022-02-08 DIAGNOSIS — M25561 Pain in right knee: Secondary | ICD-10-CM

## 2022-02-08 DIAGNOSIS — R29898 Other symptoms and signs involving the musculoskeletal system: Secondary | ICD-10-CM

## 2022-02-08 NOTE — Therapy (Signed)
[place] ?[facility] ?[address] ?[place], [place], [address] ?Phone: [phone]   Fax:  [phone] ? ?Physical Therapy Treatment ? ?Patient Details  ?Name: Kelly Finley ?MRN: [id number] ?Date of Birth: 07/15/1981 ?Referring Provider (PT): Maggie Font, MD ? ? ?Encounter Date: 02/08/2022 ? ? PT End of Session - 02/08/22 1622   ? ? Visit Number 9   ? Number of Visits 12   ? Date for PT Re-Evaluation 02/09/22   ? Authorization Type Cign[employer] - VL: 60   ? PT Start Time 1536   pt late  ? PT Stop Time 1612   ? PT Time Calculation (min) 36 min   ? Activity Tolerance Patient tolerated treatment well;Patient limited by fatigue   ? Behavior During Therapy [facility] for tasks assessed/performed   ? ?  ?  ? ?  ? ? ?Past Medical History:  ?Diagnosis Date  ? GERD (gastroesophageal reflux disease)   ? ? ?Past Surgical History:  ?Procedure Laterality Date  ? FOOT SURGERY    ? ? ?There were no vitals filed for this visit. ? ? Subjective Assessment - 02/08/22 1541   ? ? Subjective I was sore this weekend but I did stretches and that improved it. I was able to do my steps on Saturday w/o my knee giving out.   ? Diagnostic tests 11/27/21 R knee x-ray: 1.  No acute fracture or malalignment.   2.  No joint effusion.   3.  Mild medial and patellofemoral compartment degenerative changes.   ? Patient Stated Goals "get back to 100% including running for work"   ? Currently in Pain? No/denies   ? ?  ?  ? ?  ? ? ? ? ? ? ? ? ? ? ? ? ? ? ? ? ? ? ? ? [facility] Adult PT Treatment/Exercise - 02/08/22 0001   ? ?  ? Knee/Hip Exercises: Aerobic  ? Elliptical L1.5 4 min   ? Recumbent Bike L4x62min   ?  ? Knee/Hip Exercises: Standing  ? Forward Lunges Right;10 reps   ? Forward Lunges Limitations TRX depth to tolerance   ? Side Lunges Right;10 reps;3 seconds   ? Side Lunges Limitations TRX, depth to tolerance   ? Other Standing Knee Exercises single leg running balance x 10 with counter support   ? Other  Standing Knee Exercises sidesteps 4x57ft GTB at ankles   ? ?  ?  ? ?  ? ? ? ? ? ? ? ? ? ? ? ? PT Short Term Goals - 01/20/22 0817   ? ?  ? PT SHORT TERM GOAL #1  ? Title Patient will be independent with initial HEP   ? Status Achieved   01/20/22  ? Target Date 01/19/22   ?  ? PT SHORT TERM GOAL #2  ? Title Patient to report reduction in frequency and intensity of R knee pain by >/= 25-50% to allow for improved activity tolerance   ? Status Achieved   01/20/22  ? Target Date 01/19/22   ? ?  ?  ? ?  ? ? ? ? PT Long Term Goals - 01/07/22 1118   ? ?  ? PT LONG TERM GOAL #1  ? Title Patient will be independent with ongoing/advanced HEP for self-management at home   ? Status On-going   ? Target Date 02/09/22   ?  ? PT LONG TERM GOAL #2  ? Title Patient to improve  R knee AROM >/= 0-130? without pain provocation   ? Status On-going   ? Target Date 02/09/22   ?  ? PT LONG TERM GOAL #3  ? Title Patient will demonstrate improved R LE strength to >/= 4+/5 for improved stability and ease of mobility   ? Status On-going   ? Target Date 02/09/22   ?  ? PT LONG TERM GOAL #4  ? Title Patient will be able to ambulate with normal mechanics over level and uneven terrain without limitation due to R knee pain   ? Status On-going   ? Target Date 02/09/22   ?  ? PT LONG TERM GOAL #5  ? Title Patient will negotiate stairs reciprocally with normal step pattern w/o limitation due to R knee pain or weakness   ? Status On-going   ? Target Date 02/09/22   ?  ? PT LONG TERM GOAL #6  ? Title Patient to report ability to perform ADLs, household, and work-related tasks including running without limitation due to R knee pain, LOM or weakness   ? Status On-going   ? Target Date 02/09/22   ? ?  ?  ? ?  ? ? ? ? ? ? ? ? Plan - 02/08/22 1651   ? ? Clinical Impression Statement Pt continues to report throbbing pain after standing on her feet for prolonged periods of time. She also demonstrates low endurance with more dynamic strengthening, such as sidesteps.  Because she will need to be able to do high impact activites to fully return to work she would benefit from more dynamic strengthening exercises to improve confidence and allow for smooth return to PLOF.   ? Personal Factors and Comorbidities Comorbidity 2;Past/Current Experience;Time since onset of injury/illness/exacerbation;Profession   ? Comorbidities GERD/gastroparesis, migraines   ? PT Frequency 2x / week   ? PT Duration 6 weeks   ? PT Treatment/Interventions ADLs/Self Care Home Management;Cryotherapy;Electrical Stimulation;Iontophoresis 4mg /ml Dexamethasone;Moist Heat;Ultrasound;Gait training;Stair training;Functional mobility training;Therapeutic activities;Therapeutic exercise;Balance training;Neuromuscular re-education;Patient/family education;Manual techniques;Passive range of motion;Dry needling;Taping;Joint Manipulations   ? PT Next Visit Plan dynamic strengthening, sidesteps monster walks, etc; edema and pain (vaso not covered by [place])   ? PT Home Exercise Plan Access Code: [email or web address] (1/31, updated 2/9 & 2/13)   ? Consulted and Agree with Plan of Care Patient   ? ?  ?  ? ?  ? ? ?Patient will benefit from skilled therapeutic intervention in order to improve the following deficits and impairments:  Abnormal gait, Decreased activity tolerance, Decreased balance, Decreased endurance, Decreased knowledge of precautions, Decreased mobility, Decreased range of motion, Decreased strength, Difficulty walking, Increased edema, Increased fascial restricitons, Increased muscle spasms, Impaired perceived functional ability, Impaired flexibility, Improper body mechanics, Postural dysfunction, Pain ? ?Visit Diagnosis: ?Acute pain of right knee ? ?Stiffness of right knee, not elsewhere classified ? ?Muscle weakness (generalized) ? ?Other abnormalities of gait and mobility ? ?Difficulty in walking, not elsewhere classified ? ?Other symptoms and signs involving the musculoskeletal system ? ?Localized  edema ? ? ? ? ?Problem List ?There are no problems to display for this patient. ? ? ?Kelly Finley, PTA ?02/08/2022, 4:59 PM ? ?[place] ?[facility] ?[address] ?[place], [place], [address] ?Phone: [phone]   Fax:  [phone] ? ?Name: Kelly Finley ?MRN: [id number] ?Date of Birth: February 08, 1981 ? ? ? ?

## 2022-02-09 ENCOUNTER — Encounter: Payer: Self-pay | Admitting: Physical Therapy

## 2022-02-09 ENCOUNTER — Ambulatory Visit: Payer: Managed Care, Other (non HMO) | Admitting: Physical Therapy

## 2022-02-09 DIAGNOSIS — R29898 Other symptoms and signs involving the musculoskeletal system: Secondary | ICD-10-CM

## 2022-02-09 DIAGNOSIS — M25661 Stiffness of right knee, not elsewhere classified: Secondary | ICD-10-CM

## 2022-02-09 DIAGNOSIS — R2689 Other abnormalities of gait and mobility: Secondary | ICD-10-CM

## 2022-02-09 DIAGNOSIS — R600 Localized edema: Secondary | ICD-10-CM

## 2022-02-09 DIAGNOSIS — R262 Difficulty in walking, not elsewhere classified: Secondary | ICD-10-CM

## 2022-02-09 DIAGNOSIS — M6281 Muscle weakness (generalized): Secondary | ICD-10-CM

## 2022-02-09 DIAGNOSIS — M25561 Pain in right knee: Secondary | ICD-10-CM

## 2022-02-09 NOTE — Therapy (Signed)
[facility] [address]  [address] [place], [place], [address] Phone: [phone]   Fax:  [phone]  Physical Therapy Treatment / Recert  Patient Details  Name: Kelly Finley MRN: [id number] Date of Birth: 12/12/1980 Referring Provider (PT): Norberta Keens, MD   Progress Note  Reporting Period 12/29/2021 to 02/09/2022  See note below for Objective Data and Assessment of Progress/Goals.     Encounter Date: 02/09/2022   PT End of Session - 02/09/22 1448     Visit Number 10    Number of Visits 22    Date for PT Re-Evaluation 03/23/22    Authorization Type Cigna - VL: 60    PT Start Time 1448    PT Stop Time 1541    PT Time Calculation (min) 53 min    Activity Tolerance Patient tolerated treatment well    Behavior During Therapy [facility] for tasks assessed/performed             Past Medical History:  Diagnosis Date   GERD (gastroesophageal reflux disease)     Past Surgical History:  Procedure Laterality Date   FOOT SURGERY      There were no vitals filed for this visit.   Subjective Assessment - 02/09/22 1451     Subjective Pt reports an achy throbbing pain up to 5-6/10 after a full day at work but not having the sharp pain much anymore. Still wearing brace full-time at work.    Diagnostic tests 11/27/21 R knee x-ray: 1.  No acute fracture or malalignment.   2.  No joint effusion.   3.  Mild medial and patellofemoral compartment degenerative changes.    Patient Stated Goals "get back to 100% including running for work"    Currently in Pain? No/denies    Pain Score 0-No pain   up to 5-6/10 after a full day at work   Pain Location Knee    Pain Descriptors / Indicators Aching;Throbbing    Pain Type Acute pain                [facility] PT Assessment - 02/09/22 1448       Assessment   Medical Diagnosis Acute pain of R knee    Referring Provider (PT) Norberta Keens, MD    Onset Date/Surgical Date 11/10/21     Next MD Visit released by MD unless PT deems need to return to MD      Prior Function   Level of Independence Independent    Vocation Full time employment    Gaffer    Leisure running 2x/wk, working out at least 3x/wk, walking 18-20K steps while working & walks for exercise on days off      AROM   Right Knee Extension -3    Right Knee Flexion 129      PROM   Right Knee Extension 5   hyperextension     Strength   Right Hip Flexion 4+/5    Right Hip Extension 4+/5    Right Hip External Rotation  4/5    Right Hip Internal Rotation 4+/5    Right Hip ABduction 4+/5    Right Hip ADduction 4+/5    Left Hip Flexion 5/5    Left Hip Extension 5/5    Left Hip External Rotation 4/5    Left Hip Internal Rotation 5/5    Left Hip ABduction 5/5    Left Hip ADduction 5/5    Right Knee  Flexion 4+/5    Right Knee Extension 4+/5    Left Knee Flexion 5/5    Left Knee Extension 5/5    Right Ankle Dorsiflexion 4+/5    Right Ankle Plantar Flexion 4+/5   15 SLS heel raises   Left Ankle Dorsiflexion 5/5    Left Ankle Plantar Flexion 5/5                           [facility] Adult PT Treatment/Exercise - 02/09/22 1448       Ambulation/Gait   Gait Pattern Antalgic;Decreased weight shift to right;Decreased stance time - right   more pronounced with fatigue   Ambulation Surface Level      Knee/Hip Exercises: Aerobic   Recumbent Bike L4 x 6 min      Knee/Hip Exercises: Standing   Terminal Knee Extension Right;10 reps;Strengthening;Theraband    Theraband Level (Terminal Knee Extension) Level 3 (Green)    Terminal Knee Extension Limitations + minisquat    Functional Squat 10 reps;3 seconds    Functional Squat Limitations squat-touch to chair    Wall Squat 10 reps;5 seconds    Wall Squat Limitations mini-squat + hip adduction ball squeeze   cues to move feet fwd to avoid knees fwd of toes     Knee/Hip Exercises: Supine   Short Arc Quad Sets Right;10  reps    Short Arc Quad Sets Limitations hooklying with hip ADD ball squeeze      Knee/Hip Exercises: Sidelying   Clams R/L green TB clam in side plank elbow to knees x 10                       PT Short Term Goals - 01/20/22 0817       PT SHORT TERM GOAL #1   Title Patient will be independent with initial HEP    Status Achieved   01/20/22   Target Date 01/19/22      PT SHORT TERM GOAL #2   Title Patient to report reduction in frequency and intensity of R knee pain by >/= 25-50% to allow for improved activity tolerance    Status Achieved   01/20/22   Target Date 01/19/22               PT Long Term Goals - 02/09/22 1456       PT LONG TERM GOAL #1   Title Patient will be independent with ongoing/advanced HEP for self-management at home    Status Partially Met    Target Date 03/23/22      PT LONG TERM GOAL #2   Title Patient to improve R knee AROM >/= 0-130 without pain provocation    Baseline 10-120 (12/29/21); 3-129 (02/09/22)    Status On-going    Target Date 03/23/22      PT LONG TERM GOAL #3   Title Patient will demonstrate improved R LE strength to >/= 4+/5 for improved stability and ease of mobility    Baseline 3-5/ to 4/5 (12/29/21); 4/5 to 4+/5 with continued functional weakness (02/09/22)    Status Partially Met    Target Date 03/23/22      PT LONG TERM GOAL #4   Title Patient will be able to ambulate with normal mechanics over level and uneven terrain without limitation due to R knee pain    Baseline Antalgic gait with decreased weight shift to right and decreased stance time on right creating decreased step/stride  length on left (12/29/21); Improving gait mechanics but still some favoring of R LE especially with fatigue or after working multiple consecutive shifts (02/09/22)    Status Partially Met    Target Date 03/23/22      PT LONG TERM GOAL #5   Title Patient will negotiate stairs reciprocally with normal step pattern w/o limitation due to R  knee pain or weakness    Status Unable to assess   due to time contraints   Target Date 03/23/22      PT LONG TERM GOAL #6   Title Patient to report ability to perform ADLs, household, and work-related tasks including running without limitation due to R knee pain, LOM or weakness    Status On-going    Target Date 03/23/22                   Plan - 02/09/22 1541     Clinical Impression Statement Kelly Finley denies R knee pain currently but still reports an achy throbbing pain up to 5-6/10 after a full day at work, especially if having worked multiple consecutive days, but not having the sharp pain much anymore. She is still wearing her knee brace full-time at work. Her R knee AROM has progressed to 3-129 with PROM/supported extension revealing 5 of hyperextension available - pt encouraged to avoid locking knee into hyperextension during stance or gait. MMT revealing improved R LE strength to 4/5 to 4+/5 but continued functional weakness evident with squats, lunges and single leg activities on R including SLS heel raises along with reported continued difficulty with stair negotiation and inability to run which is necessary for her to return to full duty at work. All STGs met and some LTGs partially met. 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    Personal Factors and Comorbidities Comorbidity 2;Past/Current Experience;Time since onset of injury/illness/exacerbation;Profession    Comorbidities GERD/gastroparesis, migraines    Examination-Activity Limitations Bend;Caring for Others;Lift;Locomotion Level;Squat;Stairs;Stand    Examination-Participation Restrictions Community Activity;Occupation    Rehab Potential Excellent    PT Frequency 2x / week    PT Duration 6 weeks   4-6 weeks   PT Treatment/Interventions ADLs/Self Care Home  Management;Cryotherapy;Electrical Stimulation;Iontophoresis 4mg /ml Dexamethasone;Moist Heat;Ultrasound;Gait training;Stair training;Functional mobility training;Therapeutic activities;Therapeutic exercise;Balance training;Neuromuscular re-education;Patient/family education;Manual techniques;Passive range of motion;Dry needling;Taping;Joint Manipulations    PT Next Visit Plan Knee FOTO; dynamic strengthening, sidesteps monster walks, VMO strength; MT and modalities to address abnormal muscle tension/guarding, edema and pain - possible trial of Ktape for anterior knee pain/edema (vaso not covered by [place])    PT Home Exercise Plan Access Code: [email or web address] (1/31, updated 2/9 & 2/13)    Consulted and Agree with Plan of Care Patient             Patient will benefit from skilled therapeutic intervention in order to improve the following deficits and impairments:  Abnormal gait, Decreased activity tolerance, Decreased balance, Decreased endurance, Decreased knowledge of precautions, Decreased mobility, Decreased range of motion, Decreased strength, Difficulty walking, Increased edema, Increased fascial restricitons, Increased muscle spasms, Impaired perceived functional ability, Impaired flexibility, Improper body mechanics, Postural dysfunction, Pain  Visit Diagnosis: Acute pain of right knee  Stiffness of right knee, not elsewhere classified  Muscle weakness (generalized)  Other abnormalities of gait and mobility  Difficulty in walking, not elsewhere classified  Other symptoms  and signs involving the musculoskeletal system  Localized edema     Problem List There are no problems to display for this patient.   Marry Guan, PT 02/09/2022, 7:34 PM  [facility] [address]  [address] [place], [place], [address] Phone: [phone]   Fax:  [phone]  Name: Kelly Finley MRN: [id number] Date of Birth: 11/14/1981

## 2022-02-09 NOTE — Patient Instructions (Addendum)
? ? ?  Access Code: [email or web address] ?URL: [email or web address] ?Date: 02/09/2022 ?Prepared by: Annie Paras ? ?Exercises ?Seated Hamstring Stretch with Strap - 2-3 x daily - 7 x weekly - 3 reps - 30 sec hold ?Supine ITB Stretch with Strap - 2-3 x daily - 7 x weekly - 3 reps - 30 sec hold ?Prone Quadriceps Stretch with Strap - 2-3 x daily - 7 x weekly - 3 reps - 30 sec hold ?Sitting Heel Slide with Towel - 2 x daily - 7 x weekly - 2 sets - 10 reps - 3 sec hold ?Supine Hip Adduction Isometric with Ball - 2 x daily - 7 x weekly - 2 sets - 10 reps - 3 sec hold ?Small Range Straight Leg Raise - 2 x daily - 7 x weekly - 2 sets - 10 reps ?Standing Terminal Knee Extension with Resistance - 1 x daily - 7 x weekly - 2 sets - 10 reps - 5 sec hold ?Squat with Chair Touch and Resistance Loop - 1 x daily - 7 x weekly - 2 sets - 5-10 reps - 3-5 sec hold ?Wall Squat with Ball between Knees - 1 x daily - 7 x weekly - 2 sets - 5-10 reps - 5 sec hold ?Lateral Step Down (Mirrored) - 1 x daily - 7 x weekly - 2 sets - 10 reps ?Short Arc Quad with Ankle Weight (Mirrored) - 1 x daily - 7 x weekly - 3 sets - 10 reps ?Side Plank with Clam and Resistance - 1 x daily - 7 x weekly - 2 sets - 10 reps - 3 sec hold ?Standing Squat with Resisted Terminal Knee Extension - 1 x daily - 7 x weekly - 2 sets - 10 reps - 3 sec hold ?Standard Lunge - 1 x daily - 7 x weekly - 2 sets - 10 reps - 3 sec hold ? ?

## 2022-02-16 ENCOUNTER — Other Ambulatory Visit: Payer: Self-pay

## 2022-02-16 ENCOUNTER — Ambulatory Visit: Payer: Managed Care, Other (non HMO)

## 2022-02-16 DIAGNOSIS — M25561 Pain in right knee: Secondary | ICD-10-CM | POA: Diagnosis not present

## 2022-02-16 DIAGNOSIS — R2689 Other abnormalities of gait and mobility: Secondary | ICD-10-CM

## 2022-02-16 DIAGNOSIS — R262 Difficulty in walking, not elsewhere classified: Secondary | ICD-10-CM

## 2022-02-16 DIAGNOSIS — M6281 Muscle weakness (generalized): Secondary | ICD-10-CM

## 2022-02-16 DIAGNOSIS — R29898 Other symptoms and signs involving the musculoskeletal system: Secondary | ICD-10-CM

## 2022-02-16 DIAGNOSIS — R600 Localized edema: Secondary | ICD-10-CM

## 2022-02-16 DIAGNOSIS — M25661 Stiffness of right knee, not elsewhere classified: Secondary | ICD-10-CM

## 2022-02-16 NOTE — Therapy (Signed)
[facility] ?[address] ?[place], [place], [address] ?Phone: [phone]   Fax:  [phone] ? ?Physical Therapy Treatment ? ?Patient Details  ?Name: Kelly Finley ?MRN: [id number] ?Date of Birth: September 20, 1981 ?Referring Provider (PT): Maggie Font, MD ? ? ?Encounter Date: 02/16/2022 ? ? PT End of Session - 02/16/22 1032   ? ? Visit Number 11   ? Number of Visits 22   ? Date for PT Re-Evaluation 03/23/22   ? Authorization Type [employer] - VL: 60   ? PT Start Time 0940   ? PT Stop Time 1024   ? PT Time Calculation (min) 44 min   ? Activity Tolerance Patient tolerated treatment well   ? Behavior During Therapy [facility] for tasks assessed/performed   ? ?  ?  ? ?  ? ? ?Past Medical History:  ?Diagnosis Date  ? GERD (gastroesophageal reflux disease)   ? ? ?Past Surgical History:  ?Procedure Laterality Date  ? FOOT SURGERY    ? ? ?There were no vitals filed for this visit. ? ? Subjective Assessment - 02/16/22 0940   ? ? Subjective Pt reports she has been doing good. Now out of work until she is able to be 100%.   ? Diagnostic tests 11/27/21 R knee x-ray: 1.  No acute fracture or malalignment.   2.  No joint effusion.   3.  Mild medial and patellofemoral compartment degenerative changes.   ? Patient Stated Goals "get back to 100% including running for work"   ? Currently in Pain? No/denies   ? ?  ?  ? ?  ? ? ? ? ? ? ? ? ? ? ? ? ? ? ? ? ? ? ? ? [facility] PT Treatment/Exercise - 02/16/22 0001   ? ?  ? Knee/Hip Exercises: Aerobic  ? Elliptical L1.5 6 min   ?  ? Knee/Hip Exercises: Machines for Strengthening  ? Cybex Knee Flexion 25 lb BLE 2x10, 15 RLE   ? Cybex Leg Press 35lb 2x10 BLE, 15lb x 10 RLE   ?  ? Knee/Hip Exercises: Standing  ? Forward Lunges Right;2 sets;10 reps   ? Forward Lunges Limitations TRX depth to tolerance   ? Side Lunges Right;2 sets;10 reps   ? Side Lunges Limitations counter support   ? Walking with Sports Cord resisted gait with black TB around  waist - down and back long hallway, sidesteps x1 down long hallway   ? Other Standing Knee Exercises single leg running balance x 10 with counter support; 5x w/o UE support with shortened ROM   ? Other Standing Knee Exercises sidesteps and monster walk fwd/retro 2x25f GTB at ankles   pt very fatigued  ? ?  ?  ? ?  ? ? ? ? ? ? ? ? ? ? ? ? PT Short Term Goals - 01/20/22 0817   ? ?  ? PT SHORT TERM GOAL #1  ? Title Patient will be independent with initial HEP   ? Status Achieved   01/20/22  ? Target Date 01/19/22   ?  ? PT SHORT TERM GOAL #2  ? Title Patient to report reduction in frequency and intensity of R knee pain by >/= 25-50% to allow for improved activity tolerance   ? Status Achieved   01/20/22  ? Target Date 01/19/22   ? ?  ?  ? ?  ? ? ? ? PT Long Term Goals - 02/09/22 1456   ? ?  ?  PT LONG TERM GOAL #1  ? Title Patient will be independent with ongoing/advanced HEP for self-management at home   ? Status Partially Met   ? Target Date 03/23/22   ?  ? PT LONG TERM GOAL #2  ? Title Patient to improve R knee AROM >/= 0-130? without pain provocation   ? Baseline 10-120? (12/29/21); 3-129 (02/09/22)   ? Status On-going   ? Target Date 03/23/22   ?  ? PT LONG TERM GOAL #3  ? Title Patient will demonstrate improved R LE strength to >/= 4+/5 for improved stability and ease of mobility   ? Baseline 3-5/ to 4/5 (12/29/21); 4/5 to 4+/5 with continued functional weakness (02/09/22)   ? Status Partially Met   ? Target Date 03/23/22   ?  ? PT LONG TERM GOAL #4  ? Title Patient will be able to ambulate with normal mechanics over level and uneven terrain without limitation due to R knee pain   ? Baseline Antalgic gait with decreased weight shift to right and decreased stance time on right creating decreased step/stride length on left (12/29/21); Improving gait mechanics but still some favoring of R LE especially with fatigue or after working multiple consecutive shifts (02/09/22)   ? Status Partially Met   ? Target Date 03/23/22   ?   ? PT LONG TERM GOAL #5  ? Title Patient will negotiate stairs reciprocally with normal step pattern w/o limitation due to R knee pain or weakness   ? Status Unable to assess   due to time contraints  ? Target Date 03/23/22   ?  ? PT LONG TERM GOAL #6  ? Title Patient to report ability to perform ADLs, household, and work-related tasks including running without limitation due to R knee pain, LOM or weakness   ? Status On-going   ? Target Date 03/23/22   ? ?  ?  ? ?  ? ? ? ? ? ? ? ? Plan - 02/16/22 1032   ? ? Clinical Impression Statement Today was focused on dynamic functional strengthening exercises. She was fatigued with the sidesteps, monster walks, and resisted TB walks. She did report lightheadedness and nausea after the higher impact exercises but after seated rest this did subside. With instruction providing during session pt was able to progress with through exercises. Because she is now on leave from work due to her current state, she would benefit from more work simulated exercises to allow for her to return to work.   ? Personal Factors and Comorbidities Comorbidity 2;Past/Current Experience;Time since onset of injury/illness/exacerbation;Profession   ? Comorbidities GERD/gastroparesis, migraines   ? PT Frequency 2x / week   ? PT Duration 6 weeks   4-6 weeks  ? PT Treatment/Interventions ADLs/Self Care Home Management;Cryotherapy;Electrical Stimulation;Iontophoresis 22m/ml Dexamethasone;Moist Heat;Ultrasound;Gait training;Stair training;Functional mobility training;Therapeutic activities;Therapeutic exercise;Balance training;Neuromuscular re-education;Patient/family education;Manual techniques;Passive range of motion;Dry needling;Taping;Joint Manipulations   ? PT Next Visit Plan Knee FOTO; dynamic strengthening, sidesteps monster walks, VMO strength; MT and modalities to address abnormal muscle tension/guarding, edema and pain - possible trial of Ktape for anterior knee pain/edema (vaso not covered by  [facility]   ? PT Home Exercise Plan Access Code: [email or web address] (1/31, updated 2/9 & 2/13)   ? Consulted and Agree with Plan of Care Patient   ? ?  ?  ? ?  ? ? ?Patient will benefit from skilled therapeutic intervention in order to improve the following deficits and impairments:  Abnormal gait, Decreased activity tolerance, Decreased balance, Decreased  endurance, Decreased knowledge of precautions, Decreased mobility, Decreased range of motion, Decreased strength, Difficulty walking, Increased edema, Increased fascial restricitons, Increased muscle spasms, Impaired perceived functional ability, Impaired flexibility, Improper body mechanics, Postural dysfunction, Pain ? ?Visit Diagnosis: ?Acute pain of right knee ? ?Stiffness of right knee, not elsewhere classified ? ?Muscle weakness (generalized) ? ?Other abnormalities of gait and mobility ? ?Difficulty in walking, not elsewhere classified ? ?Other symptoms and signs involving the musculoskeletal system ? ?Localized edema ? ? ? ? ?Problem List ?There are no problems to display for this patient. ? ? ?Artist Pais, PTA ?02/16/2022, 10:41 AM ? ?[facility] ?[address] ?[place], [place], [address] ?Phone: [phone]   Fax:  [phone] ? ?Name: KARIMA CARRELL ?MRN: [id number] ?Date of Birth: [birth year] ? ? ? ?

## 2022-02-18 ENCOUNTER — Ambulatory Visit: Payer: Managed Care, Other (non HMO) | Admitting: Physical Therapy

## 2022-02-18 ENCOUNTER — Encounter: Payer: Self-pay | Admitting: Physical Therapy

## 2022-02-18 ENCOUNTER — Other Ambulatory Visit: Payer: Self-pay

## 2022-02-18 DIAGNOSIS — R29898 Other symptoms and signs involving the musculoskeletal system: Secondary | ICD-10-CM

## 2022-02-18 DIAGNOSIS — M6281 Muscle weakness (generalized): Secondary | ICD-10-CM

## 2022-02-18 DIAGNOSIS — M25561 Pain in right knee: Secondary | ICD-10-CM | POA: Diagnosis not present

## 2022-02-18 DIAGNOSIS — M25661 Stiffness of right knee, not elsewhere classified: Secondary | ICD-10-CM

## 2022-02-18 DIAGNOSIS — R2689 Other abnormalities of gait and mobility: Secondary | ICD-10-CM

## 2022-02-18 DIAGNOSIS — R600 Localized edema: Secondary | ICD-10-CM

## 2022-02-18 DIAGNOSIS — R262 Difficulty in walking, not elsewhere classified: Secondary | ICD-10-CM

## 2022-02-18 NOTE — Therapy (Signed)
[place] ?[facility] ?[address] ?[place], [place], [address] ?Phone: [phone]   Fax:  [phone] ? ?Physical Therapy Treatment ? ?Patient Details  ?Name: Kelly Finley ?MRN: [id number] ?Date of Birth: Jul 08, 1981 ?Referring Provider (PT): Maggie Font, MD ? ? ?Encounter Date: 02/18/2022 ? ? PT End of Session - 02/18/22 1156   ? ? Visit Number 12   ? Number of Visits 22   ? Date for PT Re-Evaluation 03/23/22   ? Authorization Type [employer]ign[employer] - VL: 60   ? PT Start Time 1150   ? PT Stop Time 1235   ? PT Time Calculation (min) 45 min   ? Activity Tolerance Patient tolerated treatment well   ? Behavior During Therapy [facility] for tasks assessed/performed   ? ?  ?  ? ?  ? ? ?Past Medical History:  ?Diagnosis Date  ? GERD (gastroesophageal reflux disease)   ? ? ?Past Surgical History:  ?Procedure Laterality Date  ? FOOT SURGERY    ? ? ?There were no vitals filed for this visit. ? ? Subjective Assessment - 02/18/22 1159   ? ? Subjective sore from last visit.   ? Patient Stated Goals "get back to 100% including running for work"   ? Currently in Pain? No/denies   ? ?  ?  ? ?  ? ? ? ? ? ? ? ? ? ? ? ? ? ? ? ? ? ? ? ? [facility] Adult PT Treatment/Exercise - 02/18/22 0001   ? ?  ? Knee/Hip Exercises: Aerobic  ? Elliptical L2 x 5 min   ?  ? Knee/Hip Exercises: Plyometrics  ? Other Plyometric Exercises lateral jumps, small amplitude; small and larger distance; diagonals, SL fwd hop; tried bil lateral hops but too much pressure; squat jumps with toes just clearing floor and counter support   ?  ? Knee/Hip Exercises: Standing  ? Forward Lunges Right;2 sets;10 reps   ? Forward Lunges Limitations Second set with blue band pulling anteriorly on tibia; also did in doorway with blue loop for HEP; TRX depth to tolerance   ? Step Down Right;10 reps   ? Step Down Limitations 8 and 9 inch step with lateral pull around lower leg with blue band for NM feedback   ? Other Standing Knee Exercises  swiss ball: SL squat R to left heel tap anteriorly x 5 (difficulty due to balance and fatigue)   ?  ? Knee/Hip Exercises: Seated  ? Other Seated Knee/Hip Exercises tibial rotations 2x10 R with foot on wash cloth   ? ?  ?  ? ?  ? ? ? ? ? ? ? ? ? ? PT Education - 02/18/22 1306   ? ? Education Details Went over current HEP and discussed normal response to increased activities   ? Person(s) Educated Patient   ? Methods Explanation   ? Comprehension Verbalized understanding   ? ?  ?  ? ?  ? ? ? PT Short Term Goals - 01/20/22 0817   ? ?  ? PT SHORT TERM GOAL #1  ? Title Patient will be independent with initial HEP   ? Status Achieved   01/20/22  ? Target Date 01/19/22   ?  ? PT SHORT TERM GOAL #2  ? Title Patient to report reduction in frequency and intensity of R knee pain by >/= 25-50% to allow for improved activity tolerance   ? Status Achieved   01/20/22  ?  Target Date 01/19/22   ? ?  ?  ? ?  ? ? ? ? PT Long Term Goals - 02/09/22 1456   ? ?  ? PT LONG TERM GOAL #1  ? Title Patient will be independent with ongoing/advanced HEP for self-management at home   ? Status Partially Met   ? Target Date 03/23/22   ?  ? PT LONG TERM GOAL #2  ? Title Patient to improve R knee AROM >/= 0-130? without pain provocation   ? Baseline 10-120? (12/29/21); 3-129 (02/09/22)   ? Status On-going   ? Target Date 03/23/22   ?  ? PT LONG TERM GOAL #3  ? Title Patient will demonstrate improved R LE strength to >/= 4+/5 for improved stability and ease of mobility   ? Baseline 3-5/ to 4/5 (12/29/21); 4/5 to 4+/5 with continued functional weakness (02/09/22)   ? Status Partially Met   ? Target Date 03/23/22   ?  ? PT LONG TERM GOAL #4  ? Title Patient will be able to ambulate with normal mechanics over level and uneven terrain without limitation due to R knee pain   ? Baseline Antalgic gait with decreased weight shift to right and decreased stance time on right creating decreased step/stride length on left (12/29/21); Improving gait mechanics but still  some favoring of R LE especially with fatigue or after working multiple consecutive shifts (02/09/22)   ? Status Partially Met   ? Target Date 03/23/22   ?  ? PT LONG TERM GOAL #5  ? Title Patient will negotiate stairs reciprocally with normal step pattern w/o limitation due to R knee pain or weakness   ? Status Unable to assess   due to time contraints  ? Target Date 03/23/22   ?  ? PT LONG TERM GOAL #6  ? Title Patient to report ability to perform ADLs, household, and work-related tasks including running without limitation due to R knee pain, LOM or weakness   ? Status On-going   ? Target Date 03/23/22   ? ?  ?  ? ?  ? ? ? ? ? ? ? ? Plan - 02/18/22 1308   ? ? Clinical Impression Statement We focused again on more dynamic functional exercises and also added in some plyometrics as tolerated to help work toward running tolerance. FOTO needs to be completed next visit.   ? PT Treatment/Interventions ADLs/Self Care Home Management;Cryotherapy;Electrical Stimulation;Iontophoresis 90m/ml Dexamethasone;Moist Heat;Ultrasound;Gait training;Stair training;Functional mobility training;Therapeutic activities;Therapeutic exercise;Balance training;Neuromuscular re-education;Patient/family education;Manual techniques;Passive range of motion;Dry needling;Taping;Joint Manipulations   ? PT Next Visit Plan Knee FOTO; dynamic strengthening, sidesteps monster walks, VMO strength; MT and modalities to address abnormal muscle tension/guarding, edema and pain - possible trial of Ktape for anterior knee pain/edema (vaso not covered by [facility]   ? ?  ?  ? ?  ? ? ?Patient will benefit from skilled therapeutic intervention in order to improve the following deficits and impairments:  Abnormal gait, Decreased activity tolerance, Decreased balance, Decreased endurance, Decreased knowledge of precautions, Decreased mobility, Decreased range of motion, Decreased strength, Difficulty walking, Increased edema, Increased fascial restricitons, Increased  muscle spasms, Impaired perceived functional ability, Impaired flexibility, Improper body mechanics, Postural dysfunction, Pain ? ?Visit Diagnosis: ?Acute pain of right knee ? ?Stiffness of right knee, not elsewhere classified ? ?Muscle weakness (generalized) ? ?Other abnormalities of gait and mobility ? ?Difficulty in walking, not elsewhere classified ? ?Other symptoms and signs involving the musculoskeletal system ? ?Localized edema ? ? ? ? ?  Problem List ?There are no problems to display for this patient. ? ? ?Madelyn Flavors, PT ?02/18/2022, 1:12 PM ? ?[place] ?[facility] ?[address] ?[place], [place], [address] ?Phone: [phone]   Fax:  [phone] ? ?Name: PRISCA GEARING ?MRN: [id number] ?Date of Birth: November 22, 1981 ? ? ? ?

## 2022-02-22 ENCOUNTER — Other Ambulatory Visit: Payer: Self-pay

## 2022-02-22 ENCOUNTER — Encounter: Payer: Self-pay | Admitting: Physical Therapy

## 2022-02-22 ENCOUNTER — Ambulatory Visit: Payer: Managed Care, Other (non HMO)

## 2022-02-22 ENCOUNTER — Ambulatory Visit: Payer: Managed Care, Other (non HMO) | Admitting: Physical Therapy

## 2022-02-22 DIAGNOSIS — R262 Difficulty in walking, not elsewhere classified: Secondary | ICD-10-CM

## 2022-02-22 DIAGNOSIS — R2689 Other abnormalities of gait and mobility: Secondary | ICD-10-CM

## 2022-02-22 DIAGNOSIS — M25561 Pain in right knee: Secondary | ICD-10-CM

## 2022-02-22 DIAGNOSIS — R600 Localized edema: Secondary | ICD-10-CM

## 2022-02-22 DIAGNOSIS — R29898 Other symptoms and signs involving the musculoskeletal system: Secondary | ICD-10-CM

## 2022-02-22 DIAGNOSIS — M6281 Muscle weakness (generalized): Secondary | ICD-10-CM

## 2022-02-22 DIAGNOSIS — M25661 Stiffness of right knee, not elsewhere classified: Secondary | ICD-10-CM

## 2022-02-22 NOTE — Therapy (Signed)
[place] ?[facility] ?[address] ?[place], [place], [address] ?Phone: [phone]   Fax:  [phone] ? ?Physical Therapy Treatment ? ?Patient Details  ?Name: Kelly Finley ?MRN: [id number] ?Date of Birth: [birth year] ?Referring Provider (PT): Maggie Font, MD ? ? ?Encounter Date: 02/22/2022 ? ? PT End of Session - 02/22/22 1405   ? ? Visit Number 13   ? Number of Visits 22   ? Date for PT Re-Evaluation 03/23/22   ? Authorization Type [employer] - VL: 60   ? PT Start Time 5284   Pt arrived late  ? PT Stop Time 1451   ? PT Time Calculation (min) 46 min   ? Activity Tolerance Patient tolerated treatment well   ? Behavior During Therapy [facility] for tasks assessed/performed   ? ?  ?  ? ?  ? ? ?Past Medical History:  ?Diagnosis Date  ? GERD (gastroesophageal reflux disease)   ? ? ?Past Surgical History:  ?Procedure Laterality Date  ? FOOT SURGERY    ? ? ?There were no vitals filed for this visit. ? ? Subjective Assessment - 02/22/22 1408   ? ? Subjective Pt denies pain other than mild soreness from working out. Has not officially tried running other than darting after the dog when it ran into the street.   ? How long can you walk comfortably? 1.5 miles   ? Diagnostic tests 11/27/21 R knee x-ray: 1.  No acute fracture or malalignment.   2.  No joint effusion.   3.  Mild medial and patellofemoral compartment degenerative changes.   ? Patient Stated Goals "get back to 100% including running for work"   ? Currently in Pain? No/denies   ? ?  ?  ? ?  ? ? ? ? ? [facility] PT Assessment - 02/22/22 1405   ? ?  ? Assessment  ? Medical Diagnosis Acute pain of R knee   ? Referring Provider (PT) Maggie Font, MD   ? Onset Date/Surgical Date 11/10/21   ?  ? Observation/Other Assessments  ? Focus on Therapeutic Outcomes (FOTO)  Knee = 68 (2 point improvement from eval); predicted D/C FS = 80   ? ?  ?  ? ?  ? ? ? ? ? ? ? ? ? ? ? ? ? ? ? ? [place] Adult PT Treatment/Exercise - 02/22/22 1405    ? ?  ? Ambulation/Gait  ? Gait Comments Attempted jog/slow run 3 x ~50 ft but pt still with limited weight shift to right and stiff leg with attempt.   ?  ? Knee/Hip Exercises: Aerobic  ? Recumbent Bike L5 x 6 min   ?  ? Knee/Hip Exercises: Plyometrics  ? Other Plyometric Exercises lateral and staggered fwd/back hop/jump weight shifts   ?  ? Knee/Hip Exercises: Standing  ? Forward Lunges Right;Left;10 reps;3 seconds   ? Forward Lunges Limitations TRX with Airex pad positioned under rear knee as a precaution - depth to tolerance   ? Side Lunges Right;Left;2 sets;10 reps;3 seconds   ? Side Lunges Limitations TRX   ? SLS with Vectors R SLS + 7-way slide to colored dots x 5   ? ?  ?  ? ?  ? ? ? ? ? ? ? ? ? ? ? ? PT Short Term Goals - 01/20/22 0817   ? ?  ? PT SHORT TERM GOAL #1  ? Title Patient will be independent with initial HEP   ?  Status Achieved   01/20/22  ? Target Date 01/19/22   ?  ? PT SHORT TERM GOAL #2  ? Title Patient to report reduction in frequency and intensity of R knee pain by >/= 25-50% to allow for improved activity tolerance   ? Status Achieved   01/20/22  ? Target Date 01/19/22   ? ?  ?  ? ?  ? ? ? ? PT Long Term Goals - 02/09/22 1456   ? ?  ? PT LONG TERM GOAL #1  ? Title Patient will be independent with ongoing/advanced HEP for self-management at home   ? Status Partially Met   ? Target Date 03/23/22   ?  ? PT LONG TERM GOAL #2  ? Title Patient to improve R knee AROM >/= 0-130? without pain provocation   ? Baseline 10-120? (12/29/21); 3-129 (02/09/22)   ? Status On-going   ? Target Date 03/23/22   ?  ? PT LONG TERM GOAL #3  ? Title Patient will demonstrate improved R LE strength to >/= 4+/5 for improved stability and ease of mobility   ? Baseline 3-5/ to 4/5 (12/29/21); 4/5 to 4+/5 with continued functional weakness (02/09/22)   ? Status Partially Met   ? Target Date 03/23/22   ?  ? PT LONG TERM GOAL #4  ? Title Patient will be able to ambulate with normal mechanics over level and uneven terrain  without limitation due to R knee pain   ? Baseline Antalgic gait with decreased weight shift to right and decreased stance time on right creating decreased step/stride length on left (12/29/21); Improving gait mechanics but still some favoring of R LE especially with fatigue or after working multiple consecutive shifts (02/09/22)   ? Status Partially Met   ? Target Date 03/23/22   ?  ? PT LONG TERM GOAL #5  ? Title Patient will negotiate stairs reciprocally with normal step pattern w/o limitation due to R knee pain or weakness   ? Status Unable to assess   due to time contraints  ? Target Date 03/23/22   ?  ? PT LONG TERM GOAL #6  ? Title Patient to report ability to perform ADLs, household, and work-related tasks including running without limitation due to R knee pain, LOM or weakness   ? Status On-going   ? Target Date 03/23/22   ? ?  ?  ? ?  ? ? ? ? ? ? ? ? Plan - 02/22/22 1451   ? ? Clinical Impression Statement [name] reports she tried to run briefly to go after the dog who had run into the street - no pain but running did not feel normal. TE continuing to target strengthening and control with dynamic patterns and light plyometrics to improve stability and agility to prepare for running. Frequent cues necessary for proper movement patterns with several exercises. Pt still fatiguing quickly with several exercises with increased shakiness noted.   ? Comorbidities GERD/gastroparesis, migraines   ? Rehab Potential Excellent   ? PT Frequency 2x / week   ? PT Duration 6 weeks   4-6 weeks  ? PT Treatment/Interventions ADLs/Self Care Home Management;Cryotherapy;Electrical Stimulation;Iontophoresis 36m/ml Dexamethasone;Moist Heat;Ultrasound;Gait training;Stair training;Functional mobility training;Therapeutic activities;Therapeutic exercise;Balance training;Neuromuscular re-education;Patient/family education;Manual techniques;Passive range of motion;Dry needling;Taping;Joint Manipulations   ? PT Next Visit Plan dynamic  strengthening, sidesteps monster walks, VMO strength; MT and modalities to address abnormal muscle tension/guarding, edema and pain - possible trial of Ktape for anterior knee pain/edema (vaso not covered by  [employer])   ? PT Home Exercise Plan Access Code: [email or web address] (1/31, updated 2/9, 2/13, 2/28 & 3/14)   ? Consulted and Agree with Plan of Care Patient   ? ?  ?  ? ?  ? ? ?Patient will benefit from skilled therapeutic intervention in order to improve the following deficits and impairments:  Abnormal gait, Decreased activity tolerance, Decreased balance, Decreased endurance, Decreased knowledge of precautions, Decreased mobility, Decreased range of motion, Decreased strength, Difficulty walking, Increased edema, Increased fascial restricitons, Increased muscle spasms, Impaired perceived functional ability, Impaired flexibility, Improper body mechanics, Postural dysfunction, Pain ? ?Visit Diagnosis: ?Acute pain of right knee ? ?Stiffness of right knee, not elsewhere classified ? ?Muscle weakness (generalized) ? ?Other abnormalities of gait and mobility ? ?Difficulty in walking, not elsewhere classified ? ?Other symptoms and signs involving the musculoskeletal system ? ?Localized edema ? ? ? ? ?Problem List ?There are no problems to display for this patient. ? ? ?Percival Spanish, PT ?02/22/2022, 5:35 PM ? ?[place] ?[facility] ?[address] ?[place], [place], [address] ?Phone: [phone]   Fax:  [phone] ? ?Name: Kelly Finley ?MRN: [id number] ?Date of Birth: 1980/12/01 ? ? ? ?

## 2022-02-23 ENCOUNTER — Ambulatory Visit: Payer: Managed Care, Other (non HMO) | Admitting: Physical Therapy

## 2022-03-03 ENCOUNTER — Ambulatory Visit: Payer: Managed Care, Other (non HMO)

## 2022-03-08 ENCOUNTER — Ambulatory Visit: Payer: Managed Care, Other (non HMO) | Attending: Family Medicine

## 2022-03-08 DIAGNOSIS — M25561 Pain in right knee: Secondary | ICD-10-CM

## 2022-03-08 DIAGNOSIS — R2689 Other abnormalities of gait and mobility: Secondary | ICD-10-CM | POA: Diagnosis present

## 2022-03-08 DIAGNOSIS — R262 Difficulty in walking, not elsewhere classified: Secondary | ICD-10-CM

## 2022-03-08 DIAGNOSIS — M6281 Muscle weakness (generalized): Secondary | ICD-10-CM

## 2022-03-08 DIAGNOSIS — R29898 Other symptoms and signs involving the musculoskeletal system: Secondary | ICD-10-CM | POA: Diagnosis present

## 2022-03-08 DIAGNOSIS — M25661 Stiffness of right knee, not elsewhere classified: Secondary | ICD-10-CM

## 2022-03-08 DIAGNOSIS — R600 Localized edema: Secondary | ICD-10-CM | POA: Diagnosis present

## 2022-03-08 NOTE — Therapy (Signed)
[facility] ?[facility] ?[address] ?[place], [place], [address] ?Phone: [phone]   Fax:  [phone] ? ?Physical Therapy Treatment ? ?Patient Details  ?Name: Kelly Finley ?MRN: [id number] ?Date of Birth: Aug 12, 1981 ?Referring Provider (PT): Maggie Font, MD ? ? ?Encounter Date: 03/08/2022 ? ? PT End of Session - 03/08/22 0933   ? ? Visit Number 14   ? Number of Visits 22   ? Date for PT Re-Evaluation 03/23/22   ? Authorization Type [employer] - VL: 60   ? PT Start Time [phone]   pt late  ? PT Stop Time 2820   ? PT Time Calculation (min) 39 min   ? Activity Tolerance Patient tolerated treatment well   ? Behavior During Therapy [facility] for tasks assessed/performed   ? ?  ?  ? ?  ? ? ?Past Medical History:  ?Diagnosis Date  ? GERD (gastroesophageal reflux disease)   ? ? ?Past Surgical History:  ?Procedure Laterality Date  ? FOOT SURGERY    ? ? ?There were no vitals filed for this visit. ? ? Subjective Assessment - 03/08/22 0856   ? ? Subjective Pt reports no pain today, been moving a lot during the day. It only hurts at night after using it all day.   ? Diagnostic tests 11/27/21 R knee x-ray: 1.  No acute fracture or malalignment.   2.  No joint effusion.   3.  Mild medial and patellofemoral compartment degenerative changes.   ? Patient Stated Goals "get back to 100% including running for work"   ? Currently in Pain? No/denies   ? ?  ?  ? ?  ? ? ? ? ? [facility] PT Assessment - 03/08/22 0001   ? ?  ? AROM  ? Right Knee Extension -2   ? Right Knee Flexion 126   ? ?  ?  ? ?  ? ? ? ? ? ? ? ? ? ? ? ? ? ? ? ? [facility] Adult PT Treatment/Exercise - 03/08/22 0001   ? ?  ? Ambulation/Gait  ? Stairs Yes   ? Stairs Assistance 7: Independent   ? Stair Management Technique No rails   ? Number of Stairs 78   ? Height of Stairs 8   ? Gait Comments pt did demonstrate decreased ecc control with L LE descending stairs   ?  ? Knee/Hip Exercises: Aerobic  ? Elliptical L2 x 4 min   ?  ? Knee/Hip  Exercises: Machines for Strengthening  ? Cybex Leg Press 35lb 2x10 BLE, 20lb RLE x 15 reps   ?  ? Knee/Hip Exercises: Plyometrics  ? Other Plyometric Exercises light jogging up and down long hallway x2   ?  ? Knee/Hip Exercises: Standing  ? Other Standing Knee Exercises sidesteps and monster walks with blue TB 2x20 ft   ? ?  ?  ? ?  ? ? ? ? ? ? ? ? ? ? ? ? PT Short Term Goals - 01/20/22 0817   ? ?  ? PT SHORT TERM GOAL #1  ? Title Patient will be independent with initial HEP   ? Status Achieved   01/20/22  ? Target Date 01/19/22   ?  ? PT SHORT TERM GOAL #2  ? Title Patient to report reduction in frequency and intensity of R knee pain by >/= 25-50% to allow for improved activity tolerance   ? Status Achieved   01/20/22  ?  Target Date 01/19/22   ? ?  ?  ? ?  ? ? ? ? PT Long Term Goals - 03/08/22 1204   ? ?  ? PT LONG TERM GOAL #1  ? Title Patient will be independent with ongoing/advanced HEP for self-management at home   ? Status Partially Met   ? Target Date 03/23/22   ?  ? PT LONG TERM GOAL #2  ? Title Patient to improve R knee AROM >/= 0-130? without pain provocation   ? Baseline 10-120? (12/29/21); 3-129 (02/09/22)   ? Status On-going   ? Target Date 03/23/22   ?  ? PT LONG TERM GOAL #3  ? Title Patient will demonstrate improved R LE strength to >/= 4+/5 for improved stability and ease of mobility   ? Baseline 3-5/ to 4/5 (12/29/21); 4/5 to 4+/5 with continued functional weakness (02/09/22)   ? Status Partially Met   ? Target Date 03/23/22   ?  ? PT LONG TERM GOAL #4  ? Title Patient will be able to ambulate with normal mechanics over level and uneven terrain without limitation due to R knee pain   ? Baseline Antalgic gait with decreased weight shift to right and decreased stance time on right creating decreased step/stride length on left (12/29/21); Improving gait mechanics but still some favoring of R LE especially with fatigue or after working multiple consecutive shifts (02/09/22)   ? Status Partially Met   ? Target  Date 03/23/22   ?  ? PT LONG TERM GOAL #5  ? Title Patient will negotiate stairs reciprocally with normal step pattern w/o limitation due to R knee pain or weakness   ? Status Partially Met   ? Target Date 03/23/22   ?  ? PT LONG TERM GOAL #6  ? Title Patient to report ability to perform ADLs, household, and work-related tasks including running without limitation due to R knee pain, LOM or weakness   ? Status On-going   ? Target Date 03/23/22   ? ?  ?  ? ?  ? ? ? ? ? ? ? ? Plan - 03/08/22 0933   ? ? Clinical Impression Statement Pt responded well to treatment. R knee ROM measured -2-0-126 deg. Some fatigue shown with monster walks and sidesteps. Cuing to correct form and isolate the correct muscles with exercises. Pt noted progressing jogging on her own but still with some fatigue, no pain. Pt had concerns about work related forms given her last appointment, advised to speak with PT about this.   ? Personal Factors and Comorbidities Comorbidity 2;Past/Current Experience;Time since onset of injury/illness/exacerbation;Profession   ? Comorbidities GERD/gastroparesis, migraines   ? PT Frequency 2x / week   ? PT Duration 6 weeks   4-6 weeks  ? PT Treatment/Interventions ADLs/Self Care Home Management;Cryotherapy;Electrical Stimulation;Iontophoresis 71m/ml Dexamethasone;Moist Heat;Ultrasound;Gait training;Stair training;Functional mobility training;Therapeutic activities;Therapeutic exercise;Balance training;Neuromuscular re-education;Patient/family education;Manual techniques;Passive range of motion;Dry needling;Taping;Joint Manipulations   ? PT Next Visit Plan dynamic strengthening, sidesteps monster walks, VMO strength; MT and modalities to address abnormal muscle tension/guarding, edema and pain - possible trial of Ktape for anterior knee pain/edema (vaso not covered by [facility]   ? PT Home Exercise Plan Access Code: [email or web address] (1/31, updated 2/9, 2/13, 2/28 & 3/14)   ? Consulted and Agree with Plan of Care Patient   ? ?   ?  ? ?  ? ? ?Patient will benefit from skilled therapeutic intervention in order to improve the following deficits and impairments:  Abnormal gait,  Decreased activity tolerance, Decreased balance, Decreased endurance, Decreased knowledge of precautions, Decreased mobility, Decreased range of motion, Decreased strength, Difficulty walking, Increased edema, Increased fascial restricitons, Increased muscle spasms, Impaired perceived functional ability, Impaired flexibility, Improper body mechanics, Postural dysfunction, Pain ? ?Visit Diagnosis: ?Acute pain of right knee ? ?Stiffness of right knee, not elsewhere classified ? ?Muscle weakness (generalized) ? ?Other abnormalities of gait and mobility ? ?Difficulty in walking, not elsewhere classified ? ?Other symptoms and signs involving the musculoskeletal system ? ?Localized edema ? ? ? ? ?Problem List ?There are no problems to display for this patient. ? ? ?Artist Pais, PTA ?03/08/2022, 12:06 PM ? ?[place] ?[facility] ?[address] ?[place], [place], [address] ?Phone: [phone]   Fax:  [phone] ? ?Name: Kelly Finley ?MRN: [id number] ?Date of Birth: Feb 03, 1981 ? ? ? ?

## 2022-03-09 ENCOUNTER — Ambulatory Visit: Payer: Managed Care, Other (non HMO) | Admitting: Physical Therapy

## 2022-03-09 ENCOUNTER — Encounter: Payer: Self-pay | Admitting: Physical Therapy

## 2022-03-09 DIAGNOSIS — R29898 Other symptoms and signs involving the musculoskeletal system: Secondary | ICD-10-CM

## 2022-03-09 DIAGNOSIS — M25661 Stiffness of right knee, not elsewhere classified: Secondary | ICD-10-CM

## 2022-03-09 DIAGNOSIS — R2689 Other abnormalities of gait and mobility: Secondary | ICD-10-CM

## 2022-03-09 DIAGNOSIS — R262 Difficulty in walking, not elsewhere classified: Secondary | ICD-10-CM

## 2022-03-09 DIAGNOSIS — M25561 Pain in right knee: Secondary | ICD-10-CM

## 2022-03-09 DIAGNOSIS — M6281 Muscle weakness (generalized): Secondary | ICD-10-CM

## 2022-03-09 DIAGNOSIS — R600 Localized edema: Secondary | ICD-10-CM

## 2022-03-09 NOTE — Therapy (Signed)
[place] ?[facility] ?[address] ?[place], [place], [address] ?Phone: [phone]   Fax:  [phone] ? ?Physical Therapy Treatment ? ?Patient Details  ?Name: Kelly Finley ?MRN: [id number] ?Date of Birth: Sep 30, 1981 ?Referring Provider (PT): Maggie Font, MD ? ? ?Encounter Date: 03/09/2022 ? ? PT End of Session - 03/09/22 1016   ? ? Visit Number 15   ? Number of Visits 22   ? Date for PT Re-Evaluation 03/23/22   ? Authorization Type [employer] - VL: 60   ? PT Start Time 1016   ? PT Stop Time 1110   ? PT Time Calculation (min) 54 min   ? Activity Tolerance Patient tolerated treatment well   ? Behavior During Therapy [facility] for tasks assessed/performed   ? ?  ?  ? ?  ? ? ?Past Medical History:  ?Diagnosis Date  ? GERD (gastroesophageal reflux disease)   ? ? ?Past Surgical History:  ?Procedure Laterality Date  ? FOOT SURGERY    ? ? ?There were no vitals filed for this visit. ? ? Subjective Assessment - 03/09/22 1020   ? ? Subjective Pt reports increased muscle soreness following her therapy session yesterday but no knee pain. Still tight or stiff when first getting up. She hopes to be able to go back to work by mid next week.   ? Diagnostic tests 11/27/21 R knee x-ray: 1.  No acute fracture or malalignment.   2.  No joint effusion.   3.  Mild medial and patellofemoral compartment degenerative changes.   ? Patient Stated Goals "get back to 100% including running for work"   ? Currently in Pain? No/denies   ? ?  ?  ? ?  ? ? ? ? ? ? ? ? ? ? ? ? ? ? ? ? ? ? ? ? [facility] Adult PT Treatment/Exercise - 03/09/22 1016   ? ?  ? Ambulation/Gait  ? Stairs Assistance 7: Independent   ? Stair Management Technique No rails;Alternating pattern;Forwards   ? Number of Stairs 28   ? Height of Stairs 7   ?  ? Exercises  ? Exercises Knee/Hip   ?  ? Knee/Hip Exercises: Aerobic  ? Elliptical L2 x 4 min   ?  ? Knee/Hip Exercises: Standing  ? Hip Flexion Right;Left;10 reps;Stengthening;Knee  straight   ? Hip Flexion Limitations SLS w/o locking stance knee into extension + green TB on opp ankle   intermittent 1-2 pole A for balance  ? Terminal Knee Extension Both;2 sets;10 reps;Strengthening;Theraband   ? Theraband Level (Terminal Knee Extension) Level 4 (Blue)   ? Terminal Knee Extension Limitations + minisquat   ? Hip ADduction Right;Left;10 reps;Strengthening   ? Hip ADduction Limitations SLS w/o locking stance knee into extension + green TB on opp ankle   intermittent 1-2 pole A for balance  ? Hip Abduction Right;Left;10 reps;Stengthening;Knee straight   ? Abduction Limitations SLS w/o locking stance knee into extension + green TB on opp ankle   intermittent 1-2 pole A for balance  ? Hip Extension Right;Left;10 reps;Stengthening;Knee straight   ? Extension Limitations SLS w/o locking stance knee into extension + green TB on opp ankle   intermittent 1-2 pole A for balance  ? Step Down Right;Left;10 reps;Hand Hold: 2   9" step  ? Step Down Limitations with lateral pull around lower leg with blue band for NM feedback   ? Functional Squat 10 reps;3 seconds   ?  Functional Squat Limitations TRX   ? Wall Squat 10 reps;3 seconds   ? Wall Squat Limitations ball on wall + hip adduction ball squeeze   ? Other Standing Knee Exercises BOSU squat x 5   intermittent 2 pole A for balance  ? Other Standing Knee Exercises wall squat with ball squeeze + alt knee extensions x10   ? ?  ?  ? ?  ? ? ? ? ? ? ? ? ? ? ? ? PT Short Term Goals - 01/20/22 0817   ? ?  ? PT SHORT TERM GOAL #1  ? Title Patient will be independent with initial HEP   ? Status Achieved   01/20/22  ? Target Date 01/19/22   ?  ? PT SHORT TERM GOAL #2  ? Title Patient to report reduction in frequency and intensity of R knee pain by >/= 25-50% to allow for improved activity tolerance   ? Status Achieved   01/20/22  ? Target Date 01/19/22   ? ?  ?  ? ?  ? ? ? ? PT Long Term Goals - 03/08/22 1204   ? ?  ? PT LONG TERM GOAL #1  ? Title Patient will be  independent with ongoing/advanced HEP for self-management at home   ? Status Partially Met   ? Target Date 03/23/22   ?  ? PT LONG TERM GOAL #2  ? Title Patient to improve R knee AROM >/= 0-130? without pain provocation   ? Baseline 10-120? (12/29/21); 3-129 (02/09/22)   ? Status On-going   ? Target Date 03/23/22   ?  ? PT LONG TERM GOAL #3  ? Title Patient will demonstrate improved R LE strength to >/= 4+/5 for improved stability and ease of mobility   ? Baseline 3-5/ to 4/5 (12/29/21); 4/5 to 4+/5 with continued functional weakness (02/09/22)   ? Status Partially Met   ? Target Date 03/23/22   ?  ? PT LONG TERM GOAL #4  ? Title Patient will be able to ambulate with normal mechanics over level and uneven terrain without limitation due to R knee pain   ? Baseline Antalgic gait with decreased weight shift to right and decreased stance time on right creating decreased step/stride length on left (12/29/21); Improving gait mechanics but still some favoring of R LE especially with fatigue or after working multiple consecutive shifts (02/09/22)   ? Status Partially Met   ? Target Date 03/23/22   ?  ? PT LONG TERM GOAL #5  ? Title Patient will negotiate stairs reciprocally with normal step pattern w/o limitation due to R knee pain or weakness   ? Status Partially Met   ? Target Date 03/23/22   ?  ? PT LONG TERM GOAL #6  ? Title Patient to report ability to perform ADLs, household, and work-related tasks including running without limitation due to R knee pain, LOM or weakness   ? Status On-going   ? Target Date 03/23/22   ? ?  ?  ? ?  ? ? ? ? ? ? ? ? Plan - 03/09/22 1110   ? ? Clinical Impression Statement Kesa reports increased muscle soreness following yesterday?s PT session as well as continued stiffness/tightness after periods of inactivity but not really having any more R knee pain. Some grinding/clicking sensation still noted at R patella/joint line with deep squats and stair negotiation, and running still does not feel  quite back to normal. R knee ROM now WNL but  still with weakness in R VMO during TKE and knee flexion activities creating some instability as noted during functional movements. Therapeutic exercises targeting quad and proximal LE stability as well as functional movement patterns including squats and stair negotiation with improving motor control noted.   ? Comorbidities GERD/gastroparesis, migraines   ? Rehab Potential Excellent   ? PT Frequency 2x / week   ? PT Duration 6 weeks   4-6 weeks  ? PT Treatment/Interventions ADLs/Self Care Home Management;Cryotherapy;Electrical Stimulation;Iontophoresis 68m/ml Dexamethasone;Moist Heat;Ultrasound;Gait training;Stair training;Functional mobility training;Therapeutic activities;Therapeutic exercise;Balance training;Neuromuscular re-education;Patient/family education;Manual techniques;Passive range of motion;Dry needling;Taping;Joint Manipulations   ? PT Next Visit Plan dynamic strengthening, sidesteps monster walks, VMO strength; running progression; stair negotiation; MT and modalities to address abnormal muscle tension/guarding, edema and pain - Ktape as benefit noted for anterior knee pain/edema (vaso not covered by [facility]   ? PT Home Exercise Plan Access Code: [email or web address]   ? Consulted and Agree with Plan of Care Patient   ? ?  ?  ? ?  ? ? ?Patient will benefit from skilled therapeutic intervention in order to improve the following deficits and impairments:  Abnormal gait, Decreased activity tolerance, Decreased balance, Decreased endurance, Decreased knowledge of precautions, Decreased mobility, Decreased range of motion, Decreased strength, Difficulty walking, Increased edema, Increased fascial restricitons, Increased muscle spasms, Impaired perceived functional ability, Impaired flexibility, Improper body mechanics, Postural dysfunction, Pain ? ?Visit Diagnosis: ?Acute pain of right knee ? ?Stiffness of right knee, not elsewhere classified ? ?Muscle weakness  (generalized) ? ?Other abnormalities of gait and mobility ? ?Difficulty in walking, not elsewhere classified ? ?Other symptoms and signs involving the musculoskeletal system ? ?Localized edema ? ? ? ? ?Problem List ?There a

## 2022-03-17 ENCOUNTER — Encounter: Payer: Self-pay | Admitting: Physical Therapy

## 2022-03-17 ENCOUNTER — Ambulatory Visit: Payer: Managed Care, Other (non HMO) | Admitting: Physical Therapy

## 2022-03-17 DIAGNOSIS — R600 Localized edema: Secondary | ICD-10-CM

## 2022-03-17 DIAGNOSIS — R2689 Other abnormalities of gait and mobility: Secondary | ICD-10-CM

## 2022-03-17 DIAGNOSIS — R262 Difficulty in walking, not elsewhere classified: Secondary | ICD-10-CM

## 2022-03-17 DIAGNOSIS — M25561 Pain in right knee: Secondary | ICD-10-CM

## 2022-03-17 DIAGNOSIS — R29898 Other symptoms and signs involving the musculoskeletal system: Secondary | ICD-10-CM

## 2022-03-17 DIAGNOSIS — M6281 Muscle weakness (generalized): Secondary | ICD-10-CM

## 2022-03-17 DIAGNOSIS — M25661 Stiffness of right knee, not elsewhere classified: Secondary | ICD-10-CM

## 2022-03-17 NOTE — Therapy (Signed)
[facility] ?[facility] ?[address] ?[place], [place], [address] ?Phone: [phone]   Fax:  [phone] ? ?Physical Therapy Treatment ? ?Patient Details  ?Name: Kelly Finley ?MRN: [id number] ?Date of Birth: 01/13/1981 ?Referring Provider (PT): Kelly Font, MD ? ? ?Encounter Date: 03/17/2022 ? ? PT End of Session - 03/17/22 1018   ? ? Visit Number 16   ? Number of Visits 22   ? Date for PT Re-Evaluation 03/23/22   ? Authorization Type Cign[employer] - VL: 60   ? PT Start Time 1018   ? PT Stop Time 7782   ? PT Time Calculation (min) 54 min   ? Activity Tolerance Patient tolerated treatment well   ? Behavior During Therapy Kelly Finley for tasks assessed/performed   ? ?  ?  ? ?  ? ? ?Past Medical History:  ?Diagnosis Date  ? GERD (gastroesophageal reflux disease)   ? ? ?Past Surgical History:  ?Procedure Laterality Date  ? FOOT SURGERY    ? ? ?There were no vitals filed for this visit. ? ? Subjective Assessment - 03/17/22 1020   ? ? Subjective Pt reports increased pain after attempting to mow her grass yesterday - had to switch from pushing up/down hill to sideways. She reports she has been working on intermittent jogging/light running - notes increased muscle tightness following attempts but tolerance improving.   ? Diagnostic tests 11/27/21 R knee x-ray: 1.  No acute fracture or malalignment.   2.  No joint effusion.   3.  Mild medial and patellofemoral compartment degenerative changes.   ? Patient Stated Goals "get back to 100% including running for work"   ? Currently in Pain? No/denies   ? ?  ?  ? ?  ? ? ? ? ? ? ? ? ? ? ? ? ? ? ? ? ? ? ? ? [facility] Adult PT Treatment/Exercise - 03/17/22 1018   ? ?  ? Ambulation/Gait  ? Stairs Assistance 7: Independent   ? Stair Management Technique No rails;Alternating pattern;Forwards   ? Number of Stairs 28   x 2  ? Height of Stairs 7   ? Gait Comments more symmetrical movement patterns with decreased favoring of R LE during both  jogging/running and reciprocal stair negoitiation today   ?  ? Exercises  ? Exercises Knee/Hip   ?  ? Knee/Hip Exercises: Stretches  ? Passive Hamstring Stretch Right;2 reps;30 seconds   ? Passive Hamstring Stretch Limitations hooklying in doorframe   ? Quad Stretch Right;2 reps;30 seconds   ? Quad Stretch Limitations SLS with hand holding ankle; also provided repeat demonstration of prone RF with strap using pillow or towel roll to place hip into more extension for better quad stretch   ? ITB Stretch Right;3 reps;30 seconds   ? ITB Stretch Limitations standing lateral flexion   ? Other Knee/Hip Stretches Butterfly hip addductor stretch x 30 sec   ?  ? Knee/Hip Exercises: Aerobic  ? Elliptical L2 x 4 min   ?  ? Knee/Hip Exercises: Standing  ? Step Down Right;Left;2 sets;10 reps;Step Height: 8";Hand Hold: 1   ? Step Down Limitations lateral and forward eccentric lowering with light heel tap   ? Other Standing Knee Exercises R/L split squat with rear foot elevated on mat table x 10   ? Other Standing Knee Exercises R/L forward step-over 8" step x 10   ? ?  ?  ? ?  ? ? ? ? ? ? ? ? ? ? ? ?  PT Short Term Goals - 01/20/22 0817   ? ?  ? PT SHORT TERM GOAL #1  ? Title Patient will be independent with initial HEP   ? Status Achieved   01/20/22  ? Target Date 01/19/22   ?  ? PT SHORT TERM GOAL #2  ? Title Patient to report reduction in frequency and intensity of R knee pain by >/= 25-50% to allow for improved activity tolerance   ? Status Achieved   01/20/22  ? Target Date 01/19/22   ? ?  ?  ? ?  ? ? ? ? PT Long Term Goals - 03/08/22 1204   ? ?  ? PT LONG TERM GOAL #1  ? Title Patient will be independent with ongoing/advanced HEP for self-management at home   ? Status Partially Met   ? Target Date 03/23/22   ?  ? PT LONG TERM GOAL #2  ? Title Patient to improve R knee AROM >/= 0-130? without pain provocation   ? Baseline 10-120? (12/29/21); 3-129 (02/09/22)   ? Status On-going   ? Target Date 03/23/22   ?  ? PT LONG TERM GOAL #3   ? Title Patient will demonstrate improved R LE strength to >/= 4+/5 for improved stability and ease of mobility   ? Baseline 3-5/ to 4/5 (12/29/21); 4/5 to 4+/5 with continued functional weakness (02/09/22)   ? Status Partially Met   ? Target Date 03/23/22   ?  ? PT LONG TERM GOAL #4  ? Title Patient will be able to ambulate with normal mechanics over level and uneven terrain without limitation due to R knee pain   ? Baseline Antalgic gait with decreased weight shift to right and decreased stance time on right creating decreased step/stride length on left (12/29/21); Improving gait mechanics but still some favoring of R LE especially with fatigue or after working multiple consecutive shifts (02/09/22)   ? Status Partially Met   ? Target Date 03/23/22   ?  ? PT LONG TERM GOAL #5  ? Title Patient will negotiate stairs reciprocally with normal step pattern w/o limitation due to R knee pain or weakness   ? Status Partially Met   ? Target Date 03/23/22   ?  ? PT LONG TERM GOAL #6  ? Title Patient to report ability to perform ADLs, household, and work-related tasks including running without limitation due to R knee pain, LOM or weakness   ? Status On-going   ? Target Date 03/23/22   ? ?  ?  ? ?  ? ? ? ? ? ? ? ? Plan - 03/17/22 1112   ? ? Clinical Impression Statement Kelly Finley states she has been training to be able to resume normal work required workouts and reports gradually improving tolerance for jogging/running but still notes some increased pressure in anterior knee while running and increased muscle tightness/fatigue following attempts. Assessment of running and reciprocal stair negotiation revealing more symmetrical movement patterns with decreased favoring of R LE. We reviewed her current stretching regimen and provided options for alternative versions of stretches as well as cues to avoid substitution during stretches. Continued strengthening with emphasis on eccentric control, providing cues for proper alignment to  reduce anterior knee strain. Kelly Finley is progressing well with PT and should be able to return to work as of next week.   ? Comorbidities GERD/gastroparesis, migraines   ? Rehab Potential Excellent   ? PT Frequency 2x / week   ? PT Duration 6  weeks   4-6 weeks  ? PT Treatment/Interventions ADLs/Self Care Home Management;Cryotherapy;Electrical Stimulation;Iontophoresis 1m/ml Dexamethasone;Moist Heat;Ultrasound;Gait training;Stair training;Functional mobility training;Therapeutic activities;Therapeutic exercise;Balance training;Neuromuscular re-education;Patient/family education;Manual techniques;Passive range of motion;Dry needling;Taping;Joint Manipulations   ? PT Next Visit Plan dynamic strengthening, sidesteps monster walks, VMO strength; running progression; stair negotiation; MT and modalities to address abnormal muscle tension/guarding, edema and pain - Ktape as benefit noted for anterior knee pain/edema (vaso not covered by [facility]   ? PT Home Exercise Plan Access Code: [email or web address]   ? Consulted and Agree with Plan of Care Patient   ? ?  ?  ? ?  ? ? ?Patient will benefit from skilled therapeutic intervention in order to improve the following deficits and impairments:  Abnormal gait, Decreased activity tolerance, Decreased balance, Decreased endurance, Decreased knowledge of precautions, Decreased mobility, Decreased range of motion, Decreased strength, Difficulty walking, Increased edema, Increased fascial restricitons, Increased muscle spasms, Impaired perceived functional ability, Impaired flexibility, Improper body mechanics, Postural dysfunction, Pain ? ?Visit Diagnosis: ?Acute pain of right knee ? ?Stiffness of right knee, not elsewhere classified ? ?Muscle weakness (generalized) ? ?Other abnormalities of gait and mobility ? ?Difficulty in walking, not elsewhere classified ? ?Other symptoms and signs involving the musculoskeletal system ? ?Localized edema ? ? ? ? ?Problem List ?There are no problems to  display for this patient. ? ? ?JPercival Spanish PT ?03/17/2022, 1:59 PM ? ?[place] ?[facility] ?[address] [address]?Phone: [phone]  Fa

## 2022-03-18 ENCOUNTER — Ambulatory Visit: Payer: Managed Care, Other (non HMO)

## 2022-03-18 DIAGNOSIS — R29898 Other symptoms and signs involving the musculoskeletal system: Secondary | ICD-10-CM

## 2022-03-18 DIAGNOSIS — M25561 Pain in right knee: Secondary | ICD-10-CM

## 2022-03-18 DIAGNOSIS — M25661 Stiffness of right knee, not elsewhere classified: Secondary | ICD-10-CM

## 2022-03-18 DIAGNOSIS — M6281 Muscle weakness (generalized): Secondary | ICD-10-CM

## 2022-03-18 DIAGNOSIS — R600 Localized edema: Secondary | ICD-10-CM

## 2022-03-18 DIAGNOSIS — R2689 Other abnormalities of gait and mobility: Secondary | ICD-10-CM

## 2022-03-18 DIAGNOSIS — R262 Difficulty in walking, not elsewhere classified: Secondary | ICD-10-CM

## 2022-03-18 NOTE — Therapy (Signed)
[place] ?[facility] ?[place] ?[place], [place], [address] ?Phone: [phone]   Fax:  [phone] ? ?Physical Therapy Treatment ? ?Patient Details  ?Name: Kelly Finley ?MRN: [id number] ?Date of Birth: October 16, 1981 ?Referring Provider (PT): Maggie Font, MD ? ? ?Encounter Date: 03/18/2022 ? ? PT End of Session - 03/18/22 1018   ? ? Visit Number 17   ? Number of Visits 22   ? Date for PT Re-Evaluation 03/23/22   ? Authorization Type [employer] - VL: 60   ? PT Start Time [phone]   ? PT Stop Time 1015   ? PT Time Calculation (min) 42 min   ? Activity Tolerance Patient tolerated treatment well   ? Behavior During Therapy [facility] for tasks assessed/performed   ? ?  ?  ? ?  ? ? ?Past Medical History:  ?Diagnosis Date  ? GERD (gastroesophageal reflux disease)   ? ? ?Past Surgical History:  ?Procedure Laterality Date  ? FOOT SURGERY    ? ? ?There were no vitals filed for this visit. ? ? Subjective Assessment - 03/18/22 0934   ? ? Subjective Pt reports she did lots of exercise yesterday, was very sore but now fine.   ? Diagnostic tests 11/27/21 R knee x-ray: 1.  No acute fracture or malalignment.   2.  No joint effusion.   3.  Mild medial and patellofemoral compartment degenerative changes.   ? Patient Stated Goals "get back to 100% including running for work"   ? Currently in Pain? No/denies   ? ?  ?  ? ?  ? ? ? ? ? ? ? ? ? ? ? ? ? ? ? ? ? ? ? ? [facility] Adult PT Treatment/Exercise - 03/18/22 0001   ? ?  ? Ambulation/Gait  ? Stairs Assistance 7: Independent   ? Stair Management Technique No rails;Alternating pattern;Forwards   ? Number of Stairs 78   ? Height of Stairs 7   ? Gait Comments see comments under LTG #5   ?  ? Knee/Hip Exercises: Aerobic  ? Elliptical L2 x 5 min   ?  ? Knee/Hip Exercises: Machines for Strengthening  ? Cybex Leg Press 35lb x 10 BLE, 20lb x 10 RLE, L LE  ?  ? Knee/Hip Exercises: Standing  ? Lateral Step Up Right;2 sets;10 reps;Step Height: 8"   ?  Lateral Step Up Limitations with blue TB pulling lateral   ? Forward Step Up Right;2 sets;10 reps;Step Height: 8"   ? Forward Step Up Limitations with blue TB pulling lateral   ? Other Standing Knee Exercises light jogging outside 1st trial 3/4 around [facility]; 2nd trial pt only able to make it 1/2 around building   ? ?  ?  ? ?  ? ? ? ? ? ? ? ? ? ? ? ? PT Short Term Goals - 01/20/22 0817   ? ?  ? PT SHORT TERM GOAL #1  ? Title Patient will be independent with initial HEP   ? Status Achieved   01/20/22  ? Target Date 01/19/22   ?  ? PT SHORT TERM GOAL #2  ? Title Patient to report reduction in frequency and intensity of R knee pain by >/= 25-50% to allow for improved activity tolerance   ? Status Achieved   01/20/22  ? Target Date 01/19/22   ? ?  ?  ? ?  ? ? ? ? PT Long Term Goals -  03/18/22 1016   ? ?  ? PT LONG TERM GOAL #1  ? Title Patient will be independent with ongoing/advanced HEP for self-management at home   ? Status Partially Met   ? Target Date 03/23/22   ?  ? PT LONG TERM GOAL #2  ? Title Patient to improve R knee AROM >/= 0-130? without pain provocation   ? Baseline 10-120? (12/29/21); 3-129 (02/09/22)   ? Status On-going   ? Target Date 03/23/22   ?  ? PT LONG TERM GOAL #3  ? Title Patient will demonstrate improved R LE strength to >/= 4+/5 for improved stability and ease of mobility   ? Baseline 3-5/ to 4/5 (12/29/21); 4/5 to 4+/5 with continued functional weakness (02/09/22)   ? Status Partially Met   ? Target Date 03/23/22   ?  ? PT LONG TERM GOAL #4  ? Title Patient will be able to ambulate with normal mechanics over level and uneven terrain without limitation due to R knee pain   ? Baseline Antalgic gait with decreased weight shift to right and decreased stance time on right creating decreased step/stride length on left (12/29/21); Improving gait mechanics but still some favoring of R LE especially with fatigue or after working multiple consecutive shifts (02/09/22)   ? Status Partially Met   ? Target  Date 03/23/22   ?  ? PT LONG TERM GOAL #5  ? Title Patient will negotiate stairs reciprocally with normal step pattern w/o limitation due to R knee pain or weakness   ? Status Partially Met   03/18/22- R LE pelvic drop noted intially with descending stairs, after first flight this decreased, crepitus noted on L knee when ascending and pain noted in L knee with ascending  ? Target Date 03/23/22   ?  ? PT LONG TERM GOAL #6  ? Title Patient to report ability to perform ADLs, household, and work-related tasks including running without limitation due to R knee pain, LOM or weakness   ? Status On-going   ? Target Date 03/23/22   ? ?  ?  ? ?  ? ? ? ? ? ? ? ? Plan - 03/18/22 1018   ? ? Clinical Impression Statement Pt still has reports of L knee pain with ascending stairs and B knee "pulling" with light jogging. She was able to increase jogging distance today. Pt denied questions with updated stretching regimen. Cues and supervision given with exercises. Continued progressing medial knee strengthening and LE strengthening to tolerance with no increased pain. Pt still is onboard with allowing next week to wrap up PT.   ? Personal Factors and Comorbidities Comorbidity 2;Past/Current Experience;Time since onset of injury/illness/exacerbation;Profession   ? Comorbidities GERD/gastroparesis, migraines   ? PT Frequency 2x / week   ? PT Duration 6 weeks   4-6 weeks  ? PT Treatment/Interventions ADLs/Self Care Home Management;Cryotherapy;Electrical Stimulation;Iontophoresis 5m/ml Dexamethasone;Moist Heat;Ultrasound;Gait training;Stair training;Functional mobility training;Therapeutic activities;Therapeutic exercise;Balance training;Neuromuscular re-education;Patient/family education;Manual techniques;Passive range of motion;Dry needling;Taping;Joint Manipulations   ? PT Next Visit Plan dynamic strengthening, sidesteps monster walks, VMO strength; running progression; stair negotiation; MT and modalities to address abnormal muscle  tension/guarding, edema and pain - [place] as benefit noted for anterior knee pain/edema (vaso not covered by [facility]   ? PT Home Exercise Plan Access Code: [email or web address]   ? Consulted and Agree with Plan of Care Patient   ? ?  ?  ? ?  ? ? ?Patient will benefit from skilled therapeutic intervention in order to  improve the following deficits and impairments:  Abnormal gait, Decreased activity tolerance, Decreased balance, Decreased endurance, Decreased knowledge of precautions, Decreased mobility, Decreased range of motion, Decreased strength, Difficulty walking, Increased edema, Increased fascial restricitons, Increased muscle spasms, Impaired perceived functional ability, Impaired flexibility, Improper body mechanics, Postural dysfunction, Pain ? ?Visit Diagnosis: ?Acute pain of right knee ? ?Stiffness of right knee, not elsewhere classified ? ?Muscle weakness (generalized) ? ?Other abnormalities of gait and mobility ? ?Difficulty in walking, not elsewhere classified ? ?Other symptoms and signs involving the musculoskeletal system ? ?Localized edema ? ? ? ? ?Problem List ?There are no problems to display for this patient. ? ? ?Artist Pais, PTA ?03/18/2022, 11:25 AM ? ?[place] [facility] ?[place] ?[place], [place], [address] ?Phone: [phone]   Fax:  [phone] ? ?Name: RICCI PAFF ?MRN: [id number] ?Date of Birth: [birth year] ? ? ? ?

## 2022-03-22 ENCOUNTER — Ambulatory Visit: Payer: Managed Care, Other (non HMO) | Admitting: Physical Therapy

## 2022-03-22 ENCOUNTER — Encounter: Payer: Self-pay | Admitting: Physical Therapy

## 2022-03-22 DIAGNOSIS — R262 Difficulty in walking, not elsewhere classified: Secondary | ICD-10-CM

## 2022-03-22 DIAGNOSIS — M25561 Pain in right knee: Secondary | ICD-10-CM | POA: Diagnosis not present

## 2022-03-22 DIAGNOSIS — R29898 Other symptoms and signs involving the musculoskeletal system: Secondary | ICD-10-CM

## 2022-03-22 DIAGNOSIS — M6281 Muscle weakness (generalized): Secondary | ICD-10-CM

## 2022-03-22 DIAGNOSIS — M25661 Stiffness of right knee, not elsewhere classified: Secondary | ICD-10-CM

## 2022-03-22 DIAGNOSIS — R2689 Other abnormalities of gait and mobility: Secondary | ICD-10-CM

## 2022-03-22 DIAGNOSIS — R600 Localized edema: Secondary | ICD-10-CM

## 2022-03-22 NOTE — Therapy (Signed)
[place] ?[facility] ?[place] ?[place], [place], [address] ?Phone: [phone]   Fax:  [phone] ? ?Physical Therapy Treatment ? ?Patient Details  ?Name: Kelly Finley ?MRN: [id number] ?Date of Birth: [birth year] ?Referring Provider (PT): Maggie Font, MD ? ? ?Encounter Date: 03/22/2022 ? ? PT End of Session - 03/22/22 1105   ? ? Visit Number 18   ? Number of Visits 22   ? Date for PT Re-Evaluation 03/23/22   ? Authorization Type [employer] - VL: 60   ? PT Start Time 1105   ? PT Stop Time 0340   ? PT Time Calculation (min) 53 min   ? Activity Tolerance Patient tolerated treatment well   ? Behavior During Therapy [facility] for tasks assessed/performed   ? ?  ?  ? ?  ? ? ?Past Medical History:  ?Diagnosis Date  ? GERD (gastroesophageal reflux disease)   ? ? ?Past Surgical History:  ?Procedure Laterality Date  ? FOOT SURGERY    ? ? ?There were no vitals filed for this visit. ? ? Subjective Assessment - 03/22/22 1107   ? ? Subjective Pt denies any concerns with her knee today but has been icing it a lot. Plans to try resuming yoga tonight. Ordered copper knee sleeves.   ? Diagnostic tests 11/27/21 R knee x-ray: 1.  No acute fracture or malalignment.   2.  No joint effusion.   3.  Mild medial and patellofemoral compartment degenerative changes.   ? Patient Stated Goals "get back to 100% including running for work"   ? Currently in Pain? No/denies   ? ?  ?  ? ?  ? ? ? ? ? [facility] PT Assessment - 03/22/22 1105   ? ?  ? Assessment  ? Medical Diagnosis Acute pain of R knee   ? Referring Provider (PT) Maggie Font, MD   ? Onset Date/Surgical Date 11/10/21   ? Next MD Visit released by MD unless PT deems need to return to MD   ?  ? AROM  ? Right Knee Extension 1   hyperextension  ? Right Knee Flexion 134   ?  ? Strength  ? Right Hip Flexion 5/5   ? Right Hip Extension 5/5   ? Right Hip External Rotation  4+/5   ? Right Hip Internal Rotation 5/5   ? Right Hip ABduction 5/5   ?  Right Hip ADduction 5/5   ? Left Hip Flexion 5/5   ? Left Hip Extension 5/5   ? Left Hip External Rotation 4+/5   ? Left Hip Internal Rotation 5/5   ? Left Hip ABduction 5/5   ? Left Hip ADduction 5/5   ? Right Knee Flexion 5/5   ? Right Knee Extension 5/5   ? Left Knee Flexion 5/5   ? Left Knee Extension 5/5   ? Right Ankle Dorsiflexion 5/5   ? Right Ankle Plantar Flexion 5/5   20 SLS heel raises  ? Left Ankle Dorsiflexion 5/5   ? Left Ankle Plantar Flexion 5/5   20 SLS heel raises  ? ?  ?  ? ?  ? ? ? ? ? ? ? ? ? ? ? ? ? ? ? ? [place] Adult PT Treatment/Exercise - 03/22/22 1105   ? ?  ? Exercises  ? Exercises Knee/Hip   ?  ? Knee/Hip Exercises: Standing  ? Hip ADduction Right;Left;10 reps;Strengthening   ? Hip ADduction Limitations green TB +  opp LE lateral step-up to 9" step   ? Hip Abduction Right;Left;10 reps;Stengthening;Knee straight   ? Abduction Limitations green TB + opp LE lateral cross-over step-up to 9" step   ? Hip Extension Right;Left;10 reps;Stengthening;Knee straight   ? Extension Limitations green TB + opp LE reverse step-up to 9" step   ? Forward Step Up Right;Left;2 sets;10 reps   9" step  ? Forward Step Up Limitations + opp LE high knee with green TB at ankle   ? ?  ?  ? ?  ? ? ? ? ? ? ? ? ? ? ? ? PT Short Term Goals - 01/20/22 0817   ? ?  ? PT SHORT TERM GOAL #1  ? Title Patient will be independent with initial HEP   ? Status Achieved   01/20/22  ? Target Date 01/19/22   ?  ? PT SHORT TERM GOAL #2  ? Title Patient to report reduction in frequency and intensity of R knee pain by >/= 25-50% to allow for improved activity tolerance   ? Status Achieved   01/20/22  ? Target Date 01/19/22   ? ?  ?  ? ?  ? ? ? ? PT Long Term Goals - 03/22/22 1122   ? ?  ? PT LONG TERM GOAL #1  ? Title Patient will be independent with ongoing/advanced HEP for self-management at home   ? Status Partially Met   ? Target Date 03/23/22   ?  ? PT LONG TERM GOAL #2  ? Title Patient to improve R knee AROM >/= 0-130? without pain  provocation   ? Baseline 10-120? (12/29/21); 3-129 (02/09/22); 1-0-134 (03/22/22)   ? Status Achieved   ? Target Date 03/23/22   ?  ? PT LONG TERM GOAL #3  ? Title Patient will demonstrate improved R LE strength to >/= 4+/5 for improved stability and ease of mobility   ? Baseline 3-5/ to 4/5 (12/29/21); 4/5 to 4+/5 with continued functional weakness (02/09/22); 5/5 except B hip ER 4+/5 (03/22/22)   ? Status Achieved   ? Target Date 03/23/22   ?  ? PT LONG TERM GOAL #4  ? Title Patient will be able to ambulate with normal mechanics over level and uneven terrain without limitation due to R knee pain   ? Baseline Antalgic gait with decreased weight shift to right and decreased stance time on right creating decreased step/stride length on left (12/29/21); Improving gait mechanics but still some favoring of R LE especially with fatigue or after working multiple consecutive shifts (02/09/22); Gait WFL/WNL w/o R knee pain (03/22/22)   ? Status Achieved   ? Target Date 03/23/22   ?  ? PT LONG TERM GOAL #5  ? Title Patient will negotiate stairs reciprocally with normal step pattern w/o limitation due to R knee pain or weakness   ? Status Achieved   03/18/22- R LE pelvic drop noted intially with descending stairs, after first flight this decreased, crepitus noted on L knee when ascending and pain noted in L knee with ascending  ? Target Date 03/23/22   ?  ? PT LONG TERM GOAL #6  ? Title Patient to report ability to perform ADLs, household, and work-related tasks including running without limitation due to R knee pain, LOM or weakness   ? Status Achieved   ? Target Date 03/23/22   ? ?  ?  ? ?  ? ? ? ? ? ? ? ? Plan - 03/22/22 1211   ? ?  Clinical Impression Statement Kelly Finley has demonstrated good progress with PT with restoration of full R knee ROM and 5/5 lower extremity strength. She reports she has been practicing jogging/running as well as stairs with minimal to no knee pain and feels that she would be able to run as needed for work.  We have simulated the tasks and workouts she will need to complete on return to work and she has been able to complete all tasks, hence will recommend that she return to work at full duty status as of 03/29/22. Return to work note completed and faxed to HR. Will complete remainder of goal/discharge assessment next visit and review recommendation for ongoing HEP.   ? Comorbidities GERD/gastroparesis, migraines   ? Rehab Potential Excellent   ? PT Frequency 2x / week   ? PT Duration 6 weeks   4-6 weeks  ? PT Treatment/Interventions ADLs/Self Care Home Management;Cryotherapy;Electrical Stimulation;Iontophoresis 30m/ml Dexamethasone;Moist Heat;Ultrasound;Gait training;Stair training;Functional mobility training;Therapeutic activities;Therapeutic exercise;Balance training;Neuromuscular re-education;Patient/family education;Manual techniques;Passive range of motion;Dry needling;Taping;Joint Manipulations   ? PT Next Visit Plan goal assessment & knee FOTO; HEP review and recommendations for ongoing performance; anticipate transition to HEP +/- 30-day hold   ? PT Home Exercise Plan Access Code: [email or web address]   ? Consulted and Agree with Plan of Care Patient   ? ?  ?  ? ?  ? ? ?Patient will benefit from skilled therapeutic intervention in order to improve the following deficits and impairments:  Abnormal gait, Decreased activity tolerance, Decreased balance, Decreased endurance, Decreased knowledge of precautions, Decreased mobility, Decreased range of motion, Decreased strength, Difficulty walking, Increased edema, Increased fascial restricitons, Increased muscle spasms, Impaired perceived functional ability, Impaired flexibility, Improper body mechanics, Postural dysfunction, Pain ? ?Visit Diagnosis: ?Acute pain of right knee ? ?Stiffness of right knee, not elsewhere classified ? ?Muscle weakness (generalized) ? ?Other abnormalities of gait and mobility ? ?Difficulty in walking, not elsewhere classified ? ?Other symptoms and  signs involving the musculoskeletal system ? ?Localized edema ? ? ? ? ?Problem List ?There are no problems to display for this patient. ? ? ?JPercival Spanish PT ?03/22/2022, 12:17 PM ? ?[place] ?[facility]

## 2022-03-23 ENCOUNTER — Ambulatory Visit: Payer: Managed Care, Other (non HMO) | Admitting: Physical Therapy

## 2022-03-23 ENCOUNTER — Encounter: Payer: Self-pay | Admitting: Physical Therapy

## 2022-03-23 DIAGNOSIS — M25561 Pain in right knee: Secondary | ICD-10-CM | POA: Diagnosis not present

## 2022-03-23 DIAGNOSIS — M25661 Stiffness of right knee, not elsewhere classified: Secondary | ICD-10-CM

## 2022-03-23 DIAGNOSIS — R600 Localized edema: Secondary | ICD-10-CM

## 2022-03-23 DIAGNOSIS — R2689 Other abnormalities of gait and mobility: Secondary | ICD-10-CM

## 2022-03-23 DIAGNOSIS — R29898 Other symptoms and signs involving the musculoskeletal system: Secondary | ICD-10-CM

## 2022-03-23 DIAGNOSIS — M6281 Muscle weakness (generalized): Secondary | ICD-10-CM

## 2022-03-23 DIAGNOSIS — R262 Difficulty in walking, not elsewhere classified: Secondary | ICD-10-CM

## 2022-03-23 NOTE — Patient Instructions (Signed)
Access Code: [email or web address] ?URL: [email or web address] ?Date: 03/23/2022 ?Prepared by: Glenetta Hew ? ?Exercises ?- Seated Hamstring Stretch with Strap (Mirrored)  - 2-3 x daily - 7 x weekly - 3 reps - 30 sec hold ?- Supine ITB Stretch with Strap  - 2-3 x daily - 7 x weekly - 3 reps - 30 sec hold ?- Prone Quad Stretch with Towel Roll and Strap (Mirrored)  - 2-3 x daily - 7 x weekly - 3 reps - 30 sec hold ?- Side Plank on Elbow with Hip Abduction  - 1 x daily - 3-4 x weekly - 2 sets - 10 reps - 3 sec hold ?- Standing Terminal Knee Extension with Resistance  - 1 x daily - 4-5 x weekly - 2 sets - 10 reps - 5 sec hold ?- Lateral Step Down (Mirrored)  - 1 x daily - 4-5 x weekly - 2 sets - 10 reps ?- Standard Lunge  - 1 x daily - 4-5 x weekly - 2 sets - 10 reps - 3 sec hold ?- Squat with Chair Touch  - 1 x daily - 4-5 x weekly - 2 sets - 10 reps - 3 sec hold ?- Single Leg Squat with Chair Touch  - 1 x daily - 3-4 x weekly - 2 sets - 10 reps - 3 sec hold ?- Wall Squat with Leg Extensions  - 1 x daily - 3-4 x weekly - 2 sets - 10 reps - 3 sec hold ?

## 2022-03-23 NOTE — Therapy (Addendum)
[facility] [address]  [place], [place], [address] Phone: [phone]   Fax:  [phone]  Physical Therapy Treatment / Progress Note / Discharge Summary  Patient Details  Name: Kelly Finley MRN: [id number] Date of Birth: 07/27/1981 Referring Provider (PT): Maggie Font, MD  Progress Note  Reporting Period 02/09/2022 to 03/23/2022  See note below for Objective Data and Assessment of Progress/Goals.     Encounter Date: 03/23/2022   PT End of Session - 03/23/22 1026     Visit Number 19    Number of Visits 22    Date for PT Re-Evaluation --    Authorization Type [employer] - VL: [id number]    PT Start Time 33   Pt arrived late   PT Stop Time 1113    PT Time Calculation (min) 47 min    Activity Tolerance Patient tolerated treatment well    Behavior During Therapy WFL for tasks assessed/performed             Past Medical History:  Diagnosis Date   GERD (gastroesophageal reflux disease)     Past Surgical History:  Procedure Laterality Date   FOOT SURGERY      There were no vitals filed for this visit.   Subjective Assessment - 03/23/22 1028     Subjective Pt reports she tried yoga last night but had to modify some poses to minimzie weight bearing on her anterior knees, mostly bothered by her L knee.    Diagnostic tests 11/27/21 R knee x-ray: 1.  No acute fracture or malalignment.   2.  No joint effusion.   3.  Mild medial and patellofemoral compartment degenerative changes.    Patient Stated Goals "get back to 100% including running for work"    Currently in Pain? No/denies                [facility] PT Assessment - 03/23/22 1026       Assessment   Medical Diagnosis Acute pain of R knee    Referring Provider (PT) Maggie Font, MD    Onset Date/Surgical Date 11/10/21    Next MD Visit released by MD      Observation/Other Assessments   Focus on Therapeutic Outcomes (FOTO)  Knee = 79 (13 point  improvement from eval)      AROM   Right Knee Extension 1   hyperextension   Right Knee Flexion 134      Strength   Right Hip Flexion 5/5    Right Hip Extension 5/5    Right Hip External Rotation  4+/5    Right Hip Internal Rotation 5/5    Right Hip ABduction 5/5    Right Hip ADduction 5/5    Left Hip Flexion 5/5    Left Hip Extension 5/5    Left Hip External Rotation 4+/5    Left Hip Internal Rotation 5/5    Left Hip ABduction 5/5    Left Hip ADduction 5/5    Right Knee Flexion 5/5    Right Knee Extension 5/5    Left Knee Flexion 5/5    Left Knee Extension 5/5    Right Ankle Dorsiflexion 5/5    Right Ankle Plantar Flexion 5/5   20 SLS heel raises   Left Ankle Dorsiflexion 5/5    Left Ankle Plantar Flexion 5/5   20 SLS heel raises  [facility] Adult PT Treatment/Exercise - 03/23/22 1026       Ambulation/Gait   Ambulation/Gait Assistance 7: Independent    Ambulation Distance (Feet) 200 Feet   x 2 (jogging)   Assistive device None    Gait Pattern Within Functional Limits    Stairs Assistance 7: Independent    Stair Management Technique No rails;Alternating pattern;Forwards    Number of Stairs 28   x 2   Height of Stairs 7      Knee/Hip Exercises: Stretches   Pension scheme manager reps;30 seconds    Quad Stretch Limitations prone with strap and rolled yoga mat under thigh above knee      Knee/Hip Exercises: Aerobic   Elliptical L2 x 5 min      Knee/Hip Exercises: Machines for Strengthening   Cybex Leg Press B LE - 35# x 10      Knee/Hip Exercises: Standing   Functional Squat 5 reps;3 seconds;2 sets    Functional Squat Limitations double and single leg squat touch to chair      Knee/Hip Exercises: Sidelying   Hip ABduction Right;Left;5 sets;Strengthening    Hip ABduction Limitations in side plank elbow to foot                     PT Education - 03/23/22 1111     Education Details Final HEP review and  consolidation    Person(s) Educated Patient    Methods Explanation;Demonstration    Comprehension Verbalized understanding;Returned demonstration              PT Short Term Goals - 01/20/22 0817       PT SHORT TERM GOAL #1   Title Patient will be independent with initial HEP    Status Achieved   01/20/22   Target Date 01/19/22      PT SHORT TERM GOAL #2   Title Patient to report reduction in frequency and intensity of R knee pain by >/= 25-50% to allow for improved activity tolerance    Status Achieved   01/20/22   Target Date 01/19/22               PT Long Term Goals - 03/23/22 1110       PT LONG TERM GOAL #1   Title Patient will be independent with ongoing/advanced HEP for self-management at home    Status Achieved    Target Date 03/23/22      PT LONG TERM GOAL #2   Title Patient to improve R knee AROM >/= 0-130 without pain provocation    Baseline 10-120 (12/29/21); 3-129 (02/09/22); 1-0-134 (03/22/22)    Status Achieved    Target Date 03/23/22      PT LONG TERM GOAL #3   Title Patient will demonstrate improved R LE strength to >/= 4+/5 for improved stability and ease of mobility    Baseline 3-5/ to 4/5 (12/29/21); 4/5 to 4+/5 with continued functional weakness (02/09/22); 5/5 except B hip ER 4+/5 (03/22/22)    Status Achieved    Target Date 03/23/22      PT LONG TERM GOAL #4   Title Patient will be able to ambulate with normal mechanics over level and uneven terrain without limitation due to R knee pain    Baseline Antalgic gait with decreased weight shift to right and decreased stance time on right creating decreased step/stride length on left (12/29/21); Improving gait mechanics but still some favoring of R LE especially with fatigue or after working  multiple consecutive shifts (02/09/22); Gait WFL/WNL w/o R knee pain (03/22/22)    Status Achieved    Target Date 03/23/22      PT LONG TERM GOAL #5   Title Patient will negotiate stairs reciprocally with normal  step pattern w/o limitation due to R knee pain or weakness    Status Achieved   03/18/22- R LE pelvic drop noted intially with descending stairs, after first flight this decreased, crepitus noted on L knee when ascending and pain noted in L knee with ascending   Target Date 03/23/22      PT LONG TERM GOAL #6   Title Patient to report ability to perform ADLs, household, and work-related tasks including running without limitation due to R knee pain, LOM or weakness    Status Achieved    Target Date 03/23/22                   Plan - 03/23/22 1113     Clinical Impression Statement Chantille has demonstrated good progress with PT with restoration of full R knee ROM and 5/5 lower extremity strength. She is able to jog/run with symmetrical pattern and ascend/descend stairs with no further favoring of her R knee. We have simulated the tasks and workouts she will need to complete on return to work and she has been able to complete these to the best of our ability to simulate her work setting. We have reviewed and condensed her HEP and she is able to provide good return demonstration of all of the current exercises. All goals met and she feels confident with the plan to transition to her HEP as she returns to work as of 03/29/22, but will place her on a 30-day hold for PT in the event that issues arise with her R knee upon return to work.    Comorbidities GERD/gastroparesis, migraines    PT Treatment/Interventions ADLs/Self Care Home Management;Cryotherapy;Electrical Stimulation;Iontophoresis 52m/ml Dexamethasone;Moist Heat;Ultrasound;Gait training;Stair training;Functional mobility training;Therapeutic activities;Therapeutic exercise;Balance training;Neuromuscular re-education;Patient/family education;Manual techniques;Passive range of motion;Dry needling;Taping;Joint Manipulations    PT Next Visit Plan transition to HEP + 30-day hold    PT Home Exercise Plan Access Code: [email or web address]    Consulted and Agree  with Plan of Care Patient             Patient will benefit from skilled therapeutic intervention in order to improve the following deficits and impairments:  Abnormal gait, Decreased activity tolerance, Decreased balance, Decreased endurance, Decreased knowledge of precautions, Decreased mobility, Decreased range of motion, Decreased strength, Difficulty walking, Increased edema, Increased fascial restricitons, Increased muscle spasms, Impaired perceived functional ability, Impaired flexibility, Improper body mechanics, Postural dysfunction, Pain  Visit Diagnosis: Acute pain of right knee  Stiffness of right knee, not elsewhere classified  Muscle weakness (generalized)  Other abnormalities of gait and mobility  Difficulty in walking, not elsewhere classified  Other symptoms and signs involving the musculoskeletal system  Localized edema     Problem List There are no problems to display for this patient.   JPercival Spanish PT 03/23/2022, 11:30 AM  [facility] [place] [address]Phone: [phone]  Fax:  [phone] Name: CBASIA MCGINTYMRN: [id number] of Birth: 7July 14, 1982  PHYSICAL THERAPY DISCHARGE SUMMARY  Visits from Start of Care: 19  Current functional level related to goals / functional outcomes:   Refer to above clinical impression for status as of last visit on 03/23/2022.  Patient was placed on hold for 30 days and has not needed to return to PT, therefore will proceed with discharge from PT for this episode.   Remaining deficits:   As above.   Education / Equipment:   HEP   Patient agrees to discharge. Patient goals were met. Patient is being discharged due to meeting the stated rehab goals.  Percival Spanish, PT, MPT 05/06/22, 5:20 PM  [facility] [address]  [place], [place],  [address] Phone: [phone]   Fax:  [phone]

## 2022-04-22 ENCOUNTER — Other Ambulatory Visit: Payer: Self-pay | Admitting: Obstetrics and Gynecology

## 2022-04-22 DIAGNOSIS — Z1231 Encounter for screening mammogram for malignant neoplasm of breast: Secondary | ICD-10-CM

## 2022-04-29 ENCOUNTER — Ambulatory Visit
Admission: RE | Admit: 2022-04-29 | Discharge: 2022-04-29 | Disposition: A | Discharge status: Home / Self Care | Payer: Managed Care, Other (non HMO) | Source: Ambulatory Visit | Attending: Obstetrics and Gynecology | Admitting: Obstetrics and Gynecology

## 2022-04-29 DIAGNOSIS — Z1231 Encounter for screening mammogram for malignant neoplasm of breast: Secondary | ICD-10-CM

## 2022-05-03 ENCOUNTER — Other Ambulatory Visit: Payer: Self-pay | Admitting: Obstetrics and Gynecology

## 2022-05-03 DIAGNOSIS — R928 Other abnormal and inconclusive findings on diagnostic imaging of breast: Secondary | ICD-10-CM

## 2022-05-07 ENCOUNTER — Ambulatory Visit
Admission: RE | Admit: 2022-05-07 | Discharge: 2022-05-07 | Disposition: A | Discharge status: Home / Self Care | Payer: Managed Care, Other (non HMO) | Source: Ambulatory Visit | Attending: Obstetrics and Gynecology | Admitting: Obstetrics and Gynecology

## 2022-05-07 ENCOUNTER — Other Ambulatory Visit: Payer: Self-pay | Admitting: Obstetrics and Gynecology

## 2022-05-07 DIAGNOSIS — R928 Other abnormal and inconclusive findings on diagnostic imaging of breast: Secondary | ICD-10-CM

## 2022-05-07 DIAGNOSIS — N6489 Other specified disorders of breast: Secondary | ICD-10-CM

## 2022-07-03 ENCOUNTER — Encounter (HOSPITAL_BASED_OUTPATIENT_CLINIC_OR_DEPARTMENT_OTHER): Payer: Self-pay | Admitting: Emergency Medicine

## 2022-07-03 ENCOUNTER — Other Ambulatory Visit: Payer: Self-pay

## 2022-07-03 ENCOUNTER — Emergency Department (HOSPITAL_BASED_OUTPATIENT_CLINIC_OR_DEPARTMENT_OTHER)
Admission: EM | Admit: 2022-07-03 | Discharge: 2022-07-03 | Disposition: A | Discharge status: Home / Self Care | Payer: Managed Care, Other (non HMO) | Attending: Emergency Medicine | Admitting: Emergency Medicine

## 2022-07-03 ENCOUNTER — Emergency Department (HOSPITAL_BASED_OUTPATIENT_CLINIC_OR_DEPARTMENT_OTHER): Payer: Managed Care, Other (non HMO)

## 2022-07-03 DIAGNOSIS — M545 Low back pain, unspecified: Secondary | ICD-10-CM | POA: Insufficient documentation

## 2022-07-03 HISTORY — DX: Gastroparesis: K31.84

## 2022-07-03 MED ORDER — OXYCODONE-ACETAMINOPHEN 5-325 MG PO TABS
1.0000 | ORAL_TABLET | ORAL | Status: DC | PRN
  Administered 2022-07-03: 1 via ORAL
  Filled 2022-07-03: qty 1

## 2022-07-03 MED ORDER — HYDROMORPHONE HCL 1 MG/ML IJ SOLN
1.0000 mg | Freq: Once | INTRAMUSCULAR | Status: AC
  Administered 2022-07-03: 1 mg via INTRAMUSCULAR
  Filled 2022-07-03: qty 1

## 2022-07-03 MED ORDER — OXYCODONE-ACETAMINOPHEN 10-325 MG PO TABS: 1.0000 | ORAL_TABLET | Freq: Four times a day (QID) | ORAL | 0 refills | Status: DC | PRN

## 2022-07-03 NOTE — ED Triage Notes (Signed)
Pt having lower back pain since this afternoon.  No dysuria, no strenuous lifting.  No numbness.  Pt having increased pain with any movement.

## 2022-07-03 NOTE — ED Provider Notes (Signed)
[facility] Provider Note: Lowella Dell, MD, FACEP  CSN: [id number] MRN: [id number] ARRIVAL: 07/03/22 at 0049 ROOM: MH02/MH02   CHIEF COMPLAINT  Back Pain   HISTORY OF PRESENT ILLNESS  07/03/22 3:04 AM Kelly Finley is a 41 y.o. female with low back pain since yesterday afternoon.  She is not aware of any injury.  She is not having dysuria.  She is having no numbness.  Pain is worse with movement and with sitting.  She had to stand over the toilet to urinate because sitting was too painful.  She is having no change in bowel or bladder function and is having no saddle anesthesia.   Past Medical History:  Diagnosis Date   Gastroparesis    GERD (gastroesophageal reflux disease)     Past Surgical History:  Procedure Laterality Date   FOOT SURGERY      History reviewed. No pertinent family history.  Social History   Tobacco Use   Smoking status: Never   Smokeless tobacco: Never  Substance Use Topics   Alcohol use: Not Currently   Drug use: Never    Prior to Admission medications   Medication Sig Start Date End Date Taking? Authorizing Provider  oxyCODONE-acetaminophen (PERCOCET) 10-325 MG tablet Take 1 tablet by mouth every 6 (six) hours as needed for pain. 07/03/22  Yes Rozanne Heumann, MD  folic acid (FOLVITE) 1 MG tablet Take 3 mg by mouth daily.    [provider]  Multiple Vitamin (MULTIVITAMIN WITH MINERALS) TABS tablet Take 1 tablet by mouth daily.    [provider]  Plecanatide (TRULANCE) 3 MG TABS Take 1 tablet by mouth daily.    [provider]  topiramate (TOPAMAX) 50 MG tablet Take 50 mg by mouth 2 (two) times daily as needed (migraine).    [provider]    Allergies Codeine, Penicillins, Shrimp [shellfish allergy], and Ibuprofen   REVIEW OF SYSTEMS  Negative except as noted here or in the History of Present Illness.   PHYSICAL EXAMINATION  Initial Vital Signs Blood pressure 114/71, pulse 83,  temperature 98.4 F (36.9 C), temperature source Oral, resp. rate 18, height 5' 9.5" (1.765 m), weight 89.8 kg, last menstrual period 07/03/2022, SpO2 100 %.  Examination General: Well-developed, well-nourished female in no acute distress; appearance consistent with age of record HENT: normocephalic; atraumatic Eyes: Normal appearance Neck: supple Heart: regular rate and rhythm Lungs: clear to auscultation bilaterally Abdomen: soft; nondistended; nontender; bowel sounds present Back: Lumbar tenderness; positive straight leg raise bilaterally, right greater than left Extremities: No deformity; full range of motion; pulses normal Neurologic: Awake, alert and oriented; motor function intact in all extremities and symmetric but examination of lower extremities limited due to pain on movement; sensation intact and symmetric in lower extremities; no facial droop Skin: Warm and dry Psychiatric: Normal mood and affect   RESULTS  Summary of this visit's results, reviewed and interpreted by myself:   EKG Interpretation  Date/Time:    Ventricular Rate:    PR Interval:    QRS Duration:   QT Interval:    QTC Calculation:   R Axis:     Text Interpretation:         Laboratory Studies: No results found for this or any previous visit (from the past 24 hour(s)). Imaging Studies: DG Lumbar Spine Complete  Result Date: 07/03/2022 CLINICAL DATA:  Low back pain. EXAM: LUMBAR SPINE - COMPLETE 4+ VIEW COMPARISON:  None Available. FINDINGS: Five non-rib-bearing lumbar vertebra. The  alignment is maintained. Vertebral body heights are normal. There is no listhesis. The posterior elements are intact. Anterior spurring at L3-L4 with preservation of disc space. No fracture, focal bone abnormality or visible pars defects. Sacroiliac joints are symmetric and normal. IMPRESSION: Mild degenerative spurring at L3-L4. Electronically Signed   By: Narda Rutherford M.D.   On: 07/03/2022 02:49    ED COURSE and MDM   Nursing notes, initial and subsequent vitals signs, including pulse oximetry, reviewed and interpreted by myself.  Vitals:   07/03/22 0057 07/03/22 0058 07/03/22 0235  BP: (!) 133/91  114/71  Pulse: 69  83  Resp: 18  18  Temp: 98.4 F (36.9 C)    TempSrc: Oral    SpO2: 100%  100%  Weight:  89.8 kg   Height:  5' 9.5" (1.765 m)    Medications  oxyCODONE-acetaminophen (PERCOCET/ROXICET) 5-325 MG per tablet 1 tablet (1 tablet Oral Given 07/03/22 0113)  HYDROmorphone (DILAUDID) injection 1 mg (has no administration in time range)   There are no signs concerning for cauda equina syndrome.  We will treat her with a course of narcotic analgesics and refer to neurosurgery.  She may have a bulging disc.  She was advised she may require an MRI to provide a definitive diagnosis.   PROCEDURES  Procedures   ED DIAGNOSES     ICD-10-CM   1. Acute midline low back pain without sciatica  M54.50          Anetria Harwick, Jonny Ruiz, MD 07/03/22 [phone]

## 2022-07-08 DIAGNOSIS — K122 Cellulitis and abscess of mouth: Secondary | ICD-10-CM | POA: Insufficient documentation

## 2022-07-08 DIAGNOSIS — R053 Chronic cough: Secondary | ICD-10-CM | POA: Insufficient documentation

## 2023-01-06 ENCOUNTER — Ambulatory Visit: Admission: RE | Admit: 2023-01-06 | Payer: Managed Care, Other (non HMO) | Source: Ambulatory Visit

## 2023-01-06 ENCOUNTER — Ambulatory Visit
Admission: RE | Admit: 2023-01-06 | Discharge: 2023-01-06 | Disposition: A | Discharge status: Home / Self Care | Payer: PRIVATE HEALTH INSURANCE | Source: Ambulatory Visit | Attending: Obstetrics and Gynecology | Admitting: Obstetrics and Gynecology

## 2023-01-06 DIAGNOSIS — N6489 Other specified disorders of breast: Secondary | ICD-10-CM

## 2023-05-03 ENCOUNTER — Encounter: Payer: Self-pay | Admitting: Internal Medicine

## 2023-05-03 ENCOUNTER — Ambulatory Visit (INDEPENDENT_AMBULATORY_CARE_PROVIDER_SITE_OTHER): Payer: PRIVATE HEALTH INSURANCE | Admitting: Internal Medicine

## 2023-05-03 VITALS — BP 98/66 | HR 76 | Temp 97.7°F | Resp 18 | Ht 69.0 in | Wt 203.7 lb

## 2023-05-03 DIAGNOSIS — L501 Idiopathic urticaria: Secondary | ICD-10-CM

## 2023-05-03 DIAGNOSIS — T7800XA Anaphylactic reaction due to unspecified food, initial encounter: Secondary | ICD-10-CM | POA: Diagnosis not present

## 2023-05-03 DIAGNOSIS — K3184 Gastroparesis: Secondary | ICD-10-CM | POA: Diagnosis not present

## 2023-05-03 DIAGNOSIS — Z888 Allergy status to other drugs, medicaments and biological substances status: Secondary | ICD-10-CM

## 2023-05-03 DIAGNOSIS — T50905A Adverse effect of unspecified drugs, medicaments and biological substances, initial encounter: Secondary | ICD-10-CM

## 2023-05-03 NOTE — Progress Notes (Unsigned)
New Patient Note  RE: Kelly Finley MRN: [id number] DOB: Aug 19, 1981 Date of Office Visit: 05/03/2023  Consult requested by: Maxie Better, MD Primary care provider: Maxie Better, MD  Chief Complaint: Urticaria  History of Present Illness: I had the pleasure of seeing Kelly Finley for initial evaluation at the [facility] on 05/04/2023. She is a 42 y.o. female, who is referred here by Maxie Better, MD for the evaluation of shrimp allergy, allergic reaction .  History obtained from patient, chart review and wife Kelly Finley.   2-3 weeks ago she was outside left arm and immediately developed itching.  Did have a bite mark which left residual bruising.  The next morning she woke up and had diffuse urticaria on arms and chest.  Since then every few days she has had recurrence of her symptoms.  Will respond with Benadryl.  Has not tried preventative antihistamines.  Does have some dermatographism and hot showers do make it worse.  Urticaria does not resolve with bruising.  Not associated red hot swollen joints.  Not associated fevers.  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  Tolerates lobster, crab and all other seafood.  Does have an EpiPen.  Reports onset of gastroparesis after her second COVID vaccination.  She can is a significant trigger.  Has not been evaluated for chicken allergy.  Currently avoids chicken, beef, pork due to gastroparesis.  Does follow with GI   She is very sensitive to medications and they will make her vomit     Assessment and Plan: Kelly Finley is a 42 y.o. female with: Idiopathic urticaria - Plan: TSH, Thyroid peroxidase antibody, Chronic Urticaria, Alpha-Gal Panel, C-reactive protein, ANA, CBC With Diff/Platelet, CMP14+EGFR  Allergy with anaphylaxis due to food - Plan: Allergy Test  Gastroparesis  Adverse effect of drug, initial encounter   Plan: Patient Instructions  Chronic Idiopathic Urticaria: Allergy testing  positive to trees, molds, dust mite, roach - this is defined as hives lasting more than 6 weeks without an identifiable trigger - hives can be from a number of different sources including infections, allergies, vibration, temperature, pressure among many others other possible causes - often an identifiable cause is not determined - some potential triggers include: stress, illness, NSAIDs, aspirin, hormonal changes - you do not have any red flag symptoms to make [place] concerned about secondary causes of hives, but we will screen for these for reassurance with: CBC w diff, CMP, tryptase, TSH, hive panel, alpha-gal panel, inflammatory markers - approximately 50% of patients with chronic hives can have some associated swelling of the face/lips/eyelids (this is not a cause for alarm and does not typically progress onto systemic allergic reactions)  Therapy Plan:  - start allegra (fexofenadine) 180mg  daily  - if hives are uncontrolled, increase to twice daily  - if hives remain uncontrolled, increase dose of zyrtec (cetirizine) to max dose of (2 pills) twice daily- this is maximum dose - can increase or decrease dosing depending on symptom control to a maximum dose of 4 tablets of antihistamine daily. Wait until hives free for at least one month prior to decreasing dose.   - if hives are still uncontrolled with the above regimen, please arrange an appointment for discussion of Xolair (omalizumab)- an injectable medication for hives  Food allergy:  - today's skin testing was positive to shellfish mix and chicken - please strictly avoid  shrimp and chicken  - for SKIN only reaction, okay to take Benadryl 2 capsules every 6  hours - for SKIN + ANY additional symptoms, OR IF concern for LIFE THREATENING reaction = Epipen Autoinjector EpiPen 0.3 mg. - If using Epinephrine autoinjector, call 911 - A food allergy action plan has been provided and discussed. - Medic Alert identification is  recommended.     Reducing Pollen Exposure  The American Academy of Allergy, Asthma and Immunology suggests the following steps to reduce your exposure to pollen during allergy seasons.    Do not hang sheets or clothing out to dry; pollen may collect on these items. Do not mow lawns or spend time around freshly cut grass; mowing stirs up pollen. Keep windows closed at night.  Keep car windows closed while driving. Minimize morning activities outdoors, a time when pollen counts are usually at their highest. Stay indoors as much as possible when pollen counts or humidity is high and on windy days when pollen tends to remain in the air longer. Use air conditioning when possible.  Many air conditioners have filters that trap the pollen spores. Use a HEPA room air filter to remove pollen form the indoor air you breathe.  DUST MITE AVOIDANCE MEASURES:  There are three main measures that need and can be taken to avoid house dust mites:  Reduce accumulation of dust in general -reduce furniture, clothing, carpeting, books, stuffed animals, especially in bedroom  Separate yourself from the dust -use pillow and mattress encasements (can be found at stores such as [employer], [employer] or online) -avoid direct exposure to air condition flow -use a HEPA filter device, especially in the bedroom; you can also use a HEPA filter vacuum cleaner -wipe dust with a moist towel instead of a dry towel or broom when cleaning  Decrease mites and/or their secretions -wash clothing and linen and stuffed animals at highest temperature possible, at least every 2 weeks -stuffed animals can also be placed in a bag and put in a freezer overnight  Despite the above measures, it is impossible to eliminate dust mites or their allergen completely from your home.  With the above measures the burden of mites in your home can be diminished, with the goal of minimizing your allergic symptoms.  Success will be reached only  when implementing and using all means together.  Control of Cockroach Allergen  Cockroach allergen has been identified as an important cause of acute attacks of asthma, especially in urban settings.  There are fifty-five species of cockroach that exist in the [place], however only three, the [place], [place] species produce allergen that can affect patients with Asthma.  Allergens can be obtained from fecal particles, egg casings and secretions from cockroaches.    Remove food sources. Reduce access to water. Seal access and entry points. Spray runways with 0.5-1% Diazinon or Chlorpyrifos Blow boric acid power under stoves and refrigerator. Place bait stations (hydramethylnon) at feeding sites.   No orders of the defined types were placed in this encounter.  Lab Orders         TSH         Thyroid peroxidase antibody         Chronic Urticaria         Alpha-Gal Panel         C-reactive protein         ANA         CBC With Diff/Platelet         CMP14+EGFR      Other allergy screening: Asthma: no Rhino conjunctivitis: no  Food allergy: yes Medication allergy:  inttolerances as above, PCN causes hives and dyspnea  Hymenoptera allergy: no Urticaria: yes Eczema:no History of recurrent infections suggestive of immunodeficency: no  Diagnostics: Skin Testing: Environmental allergy panel and select foods.  adequate controls  Results interpreted by myself and discussed with patient/family.  Airborne Adult Perc - 05/03/23 1513     Time Antigen Placed 1515    Allergen Manufacturer Waynette Buttery    Location Back    Number of Test 55    1. Control-Buffer 50% Glycerol Negative    2. Control-Histamine 4+    3. Bahia Negative    4. French Southern Territories Negative    5. Johnson Negative    6. Kentucky Blue Negative    7. Meadow Fescue Negative    8. Perennial Rye Negative    9. Timothy Negative    10. Ragweed Mix Negative    11. Cocklebur Negative    12. Plantain,  English Negative     13. Baccharis Negative    14. Dog Fennel Negative    15. Russian Thistle Negative    16. Lamb's Quarters Negative    17. Sheep Sorrell Negative    18. Rough Pigweed Negative    19. Marsh Elder, Rough Negative    20. Mugwort, Common Negative    21. Box, Elder Negative    22. Cedar, red Negative    23. Sweet Gum Negative    24. Pecan Pollen 2+    25. Pine Mix Negative    26. Walnut, Black Pollen 2+    27. Red Mulberry Negative    28. Ash Mix Negative    29. Birch Mix Negative    30. Beech American 2+    31. Cottonwood, [place] Negative    32. Hickory, White Negative    33. Maple Mix Negative    34. Oak, [place] Mix Negative    35. Sycamore Eastern Negative    36. Alternaria Alternata Negative    37. Cladosporium Herbarum Negative    38. Aspergillus Mix Negative    39. Penicillium Mix Negative    40. Bipolaris Sorokiniana (Helminthosporium) Negative    41. Drechslera Spicifera (Curvularia) Negative    42. Mucor Plumbeus Negative    43. Fusarium Moniliforme Negative    44. Aureobasidium Pullulans (pullulara) Negative    45. Rhizopus Oryzae 4+    46. Botrytis Cinera 2+    47. Epicoccum Nigrum Negative    48. Phoma Betae 2+    49. Dust Mite Mix 4+    50. Cat Hair 10,000 BAU/ml Negative    51.  Dog Epithelia Negative    52. Mixed Feathers Negative    53. Horse Epithelia Negative    54. Cockroach, German 4+    55. Tobacco Leaf Negative             13 Food Perc - 05/03/23 1513       Test Information   Time Antigen Placed 1515    Allergen Manufacturer Waynette Buttery    Location Back    Number of allergen test 13      Food   1. Peanut Negative    2. Soybean Negative    3. Wheat Negative    4. Sesame Negative    5. Milk, Cow Negative    6. Casein Negative    7. Egg White, Chicken Negative    8. Shellfish Mix 2+    9. Fish Mix Negative    10. Cashew Negative    11.  Walnut Food Negative    12. Almond Negative    13. Hazelnut Negative             Food Adult  Perc - 05/03/23 1500     Time Antigen Placed 1515    Allergen Manufacturer Waynette Buttery    Location Back    Number of allergen test 1    34. Chicken Meat 2+             Past Medical History: There are no problems to display for this patient.  Past Medical History:  Diagnosis Date   Gastroparesis    GERD (gastroesophageal reflux disease)    Past Surgical History: Past Surgical History:  Procedure Laterality Date   FOOT SURGERY     Medication List:  Current Outpatient Medications  Medication Sig Dispense Refill   linaclotide (LINZESS) 290 MCG CAPS capsule Take 290 mcg by mouth daily before breakfast.     Prucalopride Succinate (MOTEGRITY) 1 MG TABS Take by mouth.     No current facility-administered medications for this visit.   Allergies: Allergies  Allergen Reactions   Codeine Hives, Shortness Of Breath, Nausea And Vomiting, Nausea Only and Other (See Comments)    hives Other reaction(s): Other (See Comments), SOB & Hives hives    Penicillins Hives and Shortness Of Breath    Has patient had a PCN reaction causing immediate rash, facial/tongue/throat swelling, SOB or lightheadedness with hypotension: Yes Has patient had a PCN reaction causing severe rash involving mucus membranes or skin necrosis: Yes Has patient had a PCN reaction that required hospitalization: No Has patient had a PCN reaction occurring within the last 10 years: Yes If all of the above answers are "NO", then may proceed with Cephalosporin use.  Other reaction(s): SOB & Hives Has patient had a PCN reaction causing immediate rash, facial/tongue/throat swelling, SOB or lightheadedness with hypotension: Yes Has patient had a PCN reaction causing severe rash involving mucus membranes or skin necrosis: Yes Has patient had a PCN reaction that required hospitalization: No Has patient had a PCN reaction occurring within the last 10 years: Yes If all of the above answers are "NO", then may proceed with  Cephalosporin use. Has patient had a PCN reaction causing immediate rash, facial/tongue/throat swelling, SOB or lightheadedness with hypotension: Yes Has patient had a PCN reaction causing severe rash involving mucus membranes or skin necrosis: Yes Has patient had a PCN reaction that required hospitalization: No Has patient had a PCN reaction occurring within the last 10 years: Yes If all of the above answers are "NO", then may proceed with Cephalosporin use.    Shrimp [Shellfish Allergy] Hives and Shortness Of Breath   Ibuprofen Other (See Comments)    gastroparesis   Social History: Social History   Socioeconomic History   Marital status: Single    Spouse name: Not on file   Number of children: Not on file   Years of education: Not on file   Highest education level: Not on file  Occupational History   Not on file  Tobacco Use   Smoking status: Never    Passive exposure: Never   Smokeless tobacco: Never  Vaping Use   Vaping Use: Never used  Substance and Sexual Activity   Alcohol use: Yes    Comment: occ   Drug use: Never   Sexual activity: Not on file  Other Topics Concern   Not on file  Social History Narrative   Not on file   Social Determinants of  Health   Financial Resource Strain: Not on file  Food Insecurity: Not on file  Transportation Needs: Not on file  Physical Activity: Not on file  Stress: Not on file  Social Connections: Not on file   Lives in a apartment, there are no roaches in the house but is 86-year-old floor.  Does not precautions on bed and pillows.  He is to fumes, chemicals or dust.  No HEPA filter in the home and home [place] not near an interstate [place] area. Smoking: No exposure Occupation: Consulting civil engineer, previously worked for the [employer]  Environmental History: Immunologist in the house: no Engineer, civil (consulting) in the family room: no Carpet in the bedroom: yes Heating: electric Cooling: central Pet: yes dogs without access to  bedroom  Family History: Family History  Problem Relation Age of Onset   Allergic rhinitis Mother    Lupus Father    Urticaria Paternal Grandmother    Angioedema Neg Hx    Asthma Neg Hx    Atopy Neg Hx    Eczema Neg Hx    Immunodeficiency Neg Hx      ROS: All others negative except as noted per HPI.   Objective: BP 98/66   Pulse 76   Temp 97.7 F (36.5 C) (Temporal)   Resp 18   Ht 5\' 9"  (1.753 m)   Wt 203 lb 11.2 oz (92.4 kg)   SpO2 99%   BMI 30.08 kg/m  Body mass index is 30.08 kg/m.  General Appearance:  Alert, cooperative, no distress, appears stated age  Head:  Normocephalic, without obvious abnormality, atraumatic  Eyes:  Conjunctiva clear, EOM's intact  Nose: Nares normal, normal mucosa, no visible anterior polyps, and septum midline  Throat: Lips, tongue normal; teeth and gums normal, normal posterior oropharynx  Neck: Supple, symmetrical  Lungs:   clear to auscultation bilaterally, Respirations unlabored, no coughing  Heart:  regular rate and rhythm and no murmur, Appears well perfused  Extremities: No edema  Skin: Skin color, texture, turgor normal,  scar from bite mark on left arms with residual bruising.   Neurologic: No gross deficits   The plan was reviewed with the patient/family, and all questions/concerned were addressed.  It was my pleasure to see Kelly Finley today and participate in her care. Please feel free to contact me with any questions or concerns.  Sincerely,  Ferol Luz, MD Allergy & Immunology  [facility] office: [phone] [facility] office: [phone]

## 2023-05-03 NOTE — Patient Instructions (Addendum)
Chronic Idiopathic Urticaria: Allergy testing positive to trees, molds, dust mite, roach - this is defined as hives lasting more than 6 weeks without an identifiable trigger - hives can be from a number of different sources including infections, allergies, vibration, temperature, pressure among many others other possible causes - often an identifiable cause is not determined - some potential triggers include: stress, illness, NSAIDs, aspirin, hormonal changes - you do not have any red flag symptoms to make [place] concerned about secondary causes of hives, but we will screen for these for reassurance with: CBC w diff, CMP, tryptase, TSH, hive panel, alpha-gal panel, inflammatory markers - approximately 50% of patients with chronic hives can have some associated swelling of the face/lips/eyelids (this is not a cause for alarm and does not typically progress onto systemic allergic reactions)  Therapy Plan:  - start allegra (fexofenadine) 180mg  daily  - if hives are uncontrolled, increase to twice daily  - if hives remain uncontrolled, increase dose of zyrtec (cetirizine) to max dose of (2 pills) twice daily- this is maximum dose - can increase or decrease dosing depending on symptom control to a maximum dose of 4 tablets of antihistamine daily. Wait until hives free for at least one month prior to decreasing dose.   - if hives are still uncontrolled with the above regimen, please arrange an appointment for discussion of Xolair (omalizumab)- an injectable medication for hives  Food allergy:  - today's skin testing was positive to shellfish mix and chicken - please strictly avoid  shrimp and chicken  - for SKIN only reaction, okay to take Benadryl 2 capsules every 6 hours - for SKIN + ANY additional symptoms, OR IF concern for LIFE THREATENING reaction = Epipen Autoinjector EpiPen 0.3 mg. - If using Epinephrine autoinjector, call 911 - A food allergy action plan has been provided and discussed. - Medic  Alert identification is recommended.     Reducing Pollen Exposure  The American Academy of Allergy, Asthma and Immunology suggests the following steps to reduce your exposure to pollen during allergy seasons.    Do not hang sheets or clothing out to dry; pollen may collect on these items. Do not mow lawns or spend time around freshly cut grass; mowing stirs up pollen. Keep windows closed at night.  Keep car windows closed while driving. Minimize morning activities outdoors, a time when pollen counts are usually at their highest. Stay indoors as much as possible when pollen counts or humidity is high and on windy days when pollen tends to remain in the air longer. Use air conditioning when possible.  Many air conditioners have filters that trap the pollen spores. Use a HEPA room air filter to remove pollen form the indoor air you breathe.  DUST MITE AVOIDANCE MEASURES:  There are three main measures that need and can be taken to avoid house dust mites:  Reduce accumulation of dust in general -reduce furniture, clothing, carpeting, books, stuffed animals, especially in bedroom  Separate yourself from the dust -use pillow and mattress encasements (can be found at stores such as Bed, [employer], and Beyond or online) -avoid direct exposure to air condition flow -use a HEPA filter device, especially in the bedroom; you can also use a HEPA filter vacuum cleaner -wipe dust with a moist towel instead of a dry towel or broom when cleaning  Decrease mites and/or their secretions -wash clothing and linen and stuffed animals at highest temperature possible, at least every 2 weeks -stuffed animals can also be placed  in a bag and put in a freezer overnight  Despite the above measures, it is impossible to eliminate dust mites or their allergen completely from your home.  With the above measures the burden of mites in your home can be diminished, with the goal of minimizing your allergic symptoms.   Success will be reached only when implementing and using all means together.  Control of Cockroach Allergen  Cockroach allergen has been identified as an important cause of acute attacks of asthma, especially in urban settings.  There are fifty-five species of cockroach that exist in the [place], however only three, the [place], [place] species produce allergen that can affect patients with Asthma.  Allergens can be obtained from fecal particles, egg casings and secretions from cockroaches.    Remove food sources. Reduce access to water. Seal access and entry points. Spray runways with 0.5-1% Diazinon or Chlorpyrifos Blow boric acid power under stoves and refrigerator. Place bait stations (hydramethylnon) at feeding sites.

## 2023-05-04 LAB — CBC WITH DIFF/PLATELET
Basos: 1 %
Eos: 6 %
Immature Granulocytes: 0 %
MCH: 30 pg (ref 26.6–33.0)
MCHC: 33.2 g/dL (ref 31.5–35.7)
Monocytes Absolute: 0.5 10*3/uL (ref 0.1–0.9)
Monocytes: 7 %
Neutrophils Absolute: 4.2 10*3/uL (ref 1.4–7.0)

## 2023-05-04 LAB — CMP14+EGFR
CO2: 24 mmol/L (ref 20–29)
Glucose: 88 mg/dL (ref 70–99)

## 2023-05-04 LAB — ALPHA-GAL PANEL

## 2023-05-04 LAB — THYROID PEROXIDASE ANTIBODY: Thyroperoxidase Ab SerPl-aCnc: <9 IU/mL (ref 0–34)

## 2023-05-04 LAB — CHRONIC URTICARIA

## 2023-05-06 LAB — CBC WITH DIFF/PLATELET
Immature Grans (Abs): 0 10*3/uL (ref 0.0–0.1)
Lymphs: 29 %
Platelets: 238 10*3/uL (ref 150–450)
RDW: 13 % (ref 11.7–15.4)
WBC: 7.3 10*3/uL (ref 3.4–10.8)

## 2023-05-06 LAB — CMP14+EGFR
Albumin: 4 g/dL (ref 3.9–4.9)
Bilirubin Total: <0.2 mg/dL (ref 0.0–1.2)
Creatinine, Ser: 0.92 mg/dL (ref 0.57–1.00)
Potassium: 4.3 mmol/L (ref 3.5–5.2)
Sodium: 140 mmol/L (ref 134–144)

## 2023-05-06 LAB — ANA: Anti Nuclear Antibody (ANA): POSITIVE — AB

## 2023-05-06 LAB — C-REACTIVE PROTEIN: CRP: <1 mg/L (ref 0–10)

## 2023-05-06 LAB — TSH: TSH: 1.96 u[IU]/mL (ref 0.450–4.500)

## 2023-05-11 LAB — CBC WITH DIFF/PLATELET
Basophils Absolute: 0 10*3/uL (ref 0.0–0.2)
EOS (ABSOLUTE): 0.5 10*3/uL — ABNORMAL HIGH (ref 0.0–0.4)
Hematocrit: 38.6 % (ref 34.0–46.6)
Hemoglobin: 12.8 g/dL (ref 11.1–15.9)
Lymphocytes Absolute: 2.1 10*3/uL (ref 0.7–3.1)
MCV: 90 fL (ref 79–97)
Neutrophils: 57 %
RBC: 4.27 x10E6/uL (ref 3.77–5.28)

## 2023-05-11 LAB — ALPHA-GAL PANEL: Beef IgE: <0.1 kU/L

## 2023-05-11 LAB — CMP14+EGFR
ALT: 10 IU/L (ref 0–32)
AST: 13 IU/L (ref 0–40)
Albumin/Globulin Ratio: 1.3 (ref 1.2–2.2)
Alkaline Phosphatase: 56 IU/L (ref 44–121)
BUN/Creatinine Ratio: 18 (ref 9–23)
BUN: 17 mg/dL (ref 6–24)
Calcium: 8.9 mg/dL (ref 8.7–10.2)
Chloride: 102 mmol/L (ref 96–106)
Globulin, Total: 3 g/dL (ref 1.5–4.5)
Total Protein: 7 g/dL (ref 6.0–8.5)
eGFR: 80 mL/min/{1.73_m2} (ref >59)

## 2023-05-18 NOTE — Progress Notes (Signed)
I reviewed the bloodwork. Blood count, kidney function, liver function, electrolytes, thyroid, autoimmune screener, inflammation markers, chronic urticaria index (checks for autoantibodies that trigger mast cells), and alpha gal (checks for red meat allergy) were all normal which is great.   ANA was positive and can be nonspecific.  Given family history of autoimmune disease, recommend discussing further with PCP.   Thanks!

## 2023-05-31 DIAGNOSIS — N3946 Mixed incontinence: Secondary | ICD-10-CM | POA: Insufficient documentation

## 2023-05-31 DIAGNOSIS — M6289 Other specified disorders of muscle: Secondary | ICD-10-CM | POA: Insufficient documentation

## 2023-06-13 ENCOUNTER — Ambulatory Visit: Payer: Self-pay | Admitting: Internal Medicine

## 2023-11-23 ENCOUNTER — Encounter (HOSPITAL_BASED_OUTPATIENT_CLINIC_OR_DEPARTMENT_OTHER): Payer: Self-pay | Admitting: Emergency Medicine

## 2023-11-23 ENCOUNTER — Emergency Department (HOSPITAL_BASED_OUTPATIENT_CLINIC_OR_DEPARTMENT_OTHER)
Admission: EM | Admit: 2023-11-23 | Discharge: 2023-11-23 | Disposition: A | Discharge status: Home / Self Care | Payer: PRIVATE HEALTH INSURANCE | Attending: Emergency Medicine | Admitting: Emergency Medicine

## 2023-11-23 ENCOUNTER — Emergency Department (HOSPITAL_BASED_OUTPATIENT_CLINIC_OR_DEPARTMENT_OTHER): Payer: PRIVATE HEALTH INSURANCE

## 2023-11-23 DIAGNOSIS — S39012A Strain of muscle, fascia and tendon of lower back, initial encounter: Secondary | ICD-10-CM | POA: Insufficient documentation

## 2023-11-23 DIAGNOSIS — X501XXA Overexertion from prolonged static or awkward postures, initial encounter: Secondary | ICD-10-CM | POA: Insufficient documentation

## 2023-11-23 DIAGNOSIS — M545 Low back pain, unspecified: Secondary | ICD-10-CM | POA: Diagnosis present

## 2023-11-23 MED ORDER — CYCLOBENZAPRINE HCL 5 MG PO TABS
5.0000 mg | ORAL_TABLET | Freq: Once | ORAL | Status: AC
  Administered 2023-11-23: 5 mg via ORAL
  Filled 2023-11-23: qty 1

## 2023-11-23 MED ORDER — OXYCODONE-ACETAMINOPHEN 5-325 MG PO TABS: 1.0000 | ORAL_TABLET | Freq: Four times a day (QID) | ORAL | 0 refills | Status: DC | PRN

## 2023-11-23 MED ORDER — HYDROCODONE-ACETAMINOPHEN 5-325 MG PO TABS
2.0000 | ORAL_TABLET | Freq: Once | ORAL | Status: AC
  Administered 2023-11-23: 2 via ORAL
  Filled 2023-11-23: qty 2

## 2023-11-23 MED ORDER — LIDOCAINE 5 % EX PTCH: 1.0000 | MEDICATED_PATCH | CUTANEOUS | 0 refills | Status: AC

## 2023-11-23 MED ORDER — LIDOCAINE 5 % EX PTCH
1.0000 | MEDICATED_PATCH | CUTANEOUS | Status: DC
  Administered 2023-11-23: 1 via TRANSDERMAL
  Filled 2023-11-23: qty 1

## 2023-11-23 MED ORDER — OXYCODONE-ACETAMINOPHEN 5-325 MG PO TABS
1.0000 | ORAL_TABLET | Freq: Once | ORAL | Status: AC
  Administered 2023-11-23: 1 via ORAL
  Filled 2023-11-23: qty 1

## 2023-11-23 MED ORDER — METHOCARBAMOL 500 MG PO TABS: 500.0000 mg | ORAL_TABLET | Freq: Two times a day (BID) | ORAL | 0 refills | Status: DC

## 2023-11-23 MED ORDER — KETOROLAC TROMETHAMINE 60 MG/2ML IM SOLN
30.0000 mg | Freq: Once | INTRAMUSCULAR | Status: AC
  Administered 2023-11-23: 30 mg via INTRAMUSCULAR
  Filled 2023-11-23: qty 2

## 2023-11-23 NOTE — ED Provider Notes (Signed)
[place] [facility] AT [facility] Provider Note   CSN: [id number] Arrival date & time: 11/23/23  1205     History  Chief Complaint  Patient presents with   Back Pain    Kelly Finley is a 42 y.o. female.   Back Pain    42 year old female with medical history significant for GERD, gastroparesis, anxiety, constipation, depression, HSV presenting to the emergency department with acute back pain.  The patient states that she was carrying a box of firewood last night when she lost her balance and started to go down to the ground.  She twisted her back and felt an immediate pop and immediate pain in her lower back, worse on the left side.  She denies any numbness or weakness.  She did not actually fall and land on her back but she is endorsing severe pain in the middle of her back and left side.  No radiation of the pain beyond this.  No saddle numbness.  No urinary or fecal incontinence, no fevers or chills.  No other injuries or complaints.  She did not have head trauma or loss of consciousness.  She arrives GCS 15, ABC intact.  Home Medications Prior to Admission medications   Medication Sig Start Date End Date Taking? Authorizing Provider  lidocaine (LIDODERM) 5 % Place 1 patch onto the skin daily. Remove & Discard patch within 12 hours or as directed by MD 11/23/23  Yes Ernie Avena, MD  methocarbamol (ROBAXIN) 500 MG tablet Take 1 tablet (500 mg total) by mouth 2 (two) times daily. 11/23/23  Yes Ernie Avena, MD  oxyCODONE-acetaminophen (PERCOCET/ROXICET) 5-325 MG tablet Take 1-2 tablets by mouth every 6 (six) hours as needed for severe pain (pain score 7-10). 11/23/23  Yes Ernie Avena, MD  linaclotide Karlene Einstein) 290 MCG CAPS capsule Take 290 mcg by mouth daily before breakfast. 01/11/22   [provider]  Prucalopride Succinate (MOTEGRITY) 1 MG TABS Take by mouth.    [provider]      Allergies    Codeine, Penicillins, Shrimp  [shellfish allergy], and Ibuprofen    Review of Systems   Review of Systems  Musculoskeletal:  Positive for back pain.  All other systems reviewed and are negative.   Physical Exam Updated Vital Signs BP (!) 140/82 (BP Location: Right Arm)   Pulse 71   Temp 99 F (37.2 C)   Resp 16   Ht 5\' 9"  (1.753 m)   Wt 90.3 kg   SpO2 100%   BMI 29.39 kg/m  Physical Exam Vitals and nursing note reviewed.  Constitutional:      Appearance: She is well-developed.     Comments: Tearful  HENT:     Head: Normocephalic and atraumatic.  Eyes:     Conjunctiva/sclera: Conjunctivae normal.  Cardiovascular:     Rate and Rhythm: Normal rate and regular rhythm.  Pulmonary:     Effort: Pulmonary effort is normal. No respiratory distress.     Breath sounds: Normal breath sounds.  Abdominal:     Palpations: Abdomen is soft.     Tenderness: There is no abdominal tenderness.  Musculoskeletal:        General: No swelling.     Cervical back: Neck supple.     Comments: No midline tenderness of the thoracic spine, midline tenderness of the lower lumbar spine with left-sided paraspinal muscle tenderness as well, negative straight leg raise test bilaterally  Skin:    General: Skin is warm and dry.  Capillary Refill: Capillary refill takes less than 2 seconds.  Neurological:     Mental Status: She is alert.     Comments: 5 out of 5 strength in the bilateral lower extremities with intact sensation to light touch  Psychiatric:        Mood and Affect: Mood normal.     ED Results / Procedures / Treatments   Labs (all labs ordered are listed, but only abnormal results are displayed) Labs Reviewed - No data to display  EKG None  Radiology CT Lumbar Spine Wo Contrast Result Date: 11/23/2023 CLINICAL DATA:  Back trauma. Pain in back after stepping into a dip while carrying firewood. EXAM: CT LUMBAR SPINE WITHOUT CONTRAST TECHNIQUE: Multidetector CT imaging of the lumbar spine was performed without  intravenous contrast administration. Multiplanar CT image reconstructions were also generated. RADIATION DOSE REDUCTION: This exam was performed according to the departmental dose-optimization program which includes automated exposure control, adjustment of the mA and/or kV according to patient size and/or use of iterative reconstruction technique. COMPARISON:  Lumbar spine radiographs 07/03/2022. FINDINGS: Segmentation: 5 non rib-bearing lumbar type vertebral bodies are present. The lowest fully formed vertebral body is L5. Alignment: No significant listhesis is present. Lumbar lordosis is preserved. Vertebrae: Vertebral body heights are normal. No acute or healing fractures are present. Paraspinal and other soft tissues: A punctate nonobstructing stone is present at the upper pole of the right kidney. The visualized abdomen is otherwise unremarkable. No significant adenopathy is present. Disc levels: No significant focal disc disease or stenosis is present in the lumbar spine. IMPRESSION: 1. No acute or healing fractures of the lumbar spine. 2. No significant focal disc disease or stenosis in the lumbar spine. 3. Punctate nonobstructing stone at the upper pole of the right kidney. Electronically Signed   By: Marin Roberts M.D.   On: 11/23/2023 14:44    Procedures Procedures    Medications Ordered in ED Medications  lidocaine (LIDODERM) 5 % 1 patch (1 patch Transdermal Patch Applied 11/23/23 1308)  oxyCODONE-acetaminophen (PERCOCET/ROXICET) 5-325 MG per tablet 1 tablet (has no administration in time range)  HYDROcodone-acetaminophen (NORCO/VICODIN) 5-325 MG per tablet 2 tablet (2 tablets Oral Given 11/23/23 1307)  cyclobenzaprine (FLEXERIL) tablet 5 mg (5 mg Oral Given 11/23/23 1306)  ketorolac (TORADOL) injection 30 mg (30 mg Intramuscular Given 11/23/23 1349)    ED Course/ Medical Decision Making/ A&P                                 Medical Decision Making Amount and/or Complexity of  Data Reviewed Labs: ordered. Radiology: ordered.  Risk Prescription drug management.    42 year old female with medical history significant for GERD, gastroparesis, anxiety, constipation, depression, HSV presenting to the emergency department with acute back pain.  The patient states that she was carrying a box of firewood last night when she lost her balance and started to go down to the ground.  She twisted her back and felt an immediate pop and immediate pain in her lower back, worse on the left side.  She denies any numbness or weakness.  She did not actually fall and land on her back but she is endorsing severe pain in the middle of her back and left side.  No radiation of the pain beyond this.  No saddle numbness.  No urinary or fecal incontinence, no fevers or chills.  No other injuries or complaints.  She did not have  head trauma or loss of consciousness.  She arrives GCS 15, ABC intact.    On arrival, the patient was afebrile, vitally stable, BP 140/82, saturating well on room air.  Physical exam revealed midline tenderness as well as left-sided paraspinal muscular tenderness.  Intact strength and sensation in the lower extremities. Patient endorsing complete sensation of the perineum.  Patient without episodes of fecal or urinary incontinence.  Patient has no focal neurologic deficits and reassuring vital signs at this time. With midline TTP and severe pain, will approach with multimodal pain control and obtain CT Lumbar Spine.  Reviewed and confirmed nursing documentation for past medical history, family history, social history.    Initial Assessment:   With the patient's presentation of acute back pain in the above setting, most likely diagnosis is musculoskeletal strain vs herniated disc. Other diagnoses were considered including (but not limited to) underlying fracture, epidural hematoma, cauda equina syndrome, spinal stenosis, spinal malignancy. These are considered less likely due to  history of present illness and physical exam findings.   In particular, lack of fever, substantial history of IV drug use, or substantial neurologic abnormality is less consistent with epidural abscess versus discitis or other spinal infection.   Initial Plan:  Multimodal pain control described and patient informed on safe usage.  Screening evaluation including below radiographic evaluation reviewed and grossly unremarkable at this time. Patient stable for continued outpatient evaluation and management of their musculoskeletal pains.  Patient referred back to primary care provider for continued evaluation and management.   Initial Study Results:   Radiology CT Lumbar Spine WO:  FINDINGS:  Segmentation: 5 non rib-bearing lumbar type vertebral bodies are  present. The lowest fully formed vertebral body is L5.    Alignment: No significant listhesis is present. Lumbar lordosis is  preserved.    Vertebrae: Vertebral body heights are normal. No acute or healing  fractures are present.    Paraspinal and other soft tissues: A punctate nonobstructing stone  is present at the upper pole of the right kidney. The visualized  abdomen is otherwise unremarkable. No significant adenopathy is  present.    Disc levels: No significant focal disc disease or stenosis is  present in the lumbar spine.    IMPRESSION:  1. No acute or healing fractures of the lumbar spine.  2. No significant focal disc disease or stenosis in the lumbar  spine.  3. Punctate nonobstructing stone at the upper pole of the right  kidney.    Disposition:   Based on the above findings, I believe patient is stable for discharge.    Patient and family educated about specific return precautions for given chief complaint and symptoms.  Patient and family educated about follow-up with herPCP.  Patient and family expressed understanding of return precautions and need for follow-up. Patient spoken to regarding all imaging and  laboratory results and appropriate follow up for these results. All education provided in verbal and written form and time was allowed for answering of patient questions. Patient discharged.     Emergency Department Medication Summary:   Medications  lidocaine (LIDODERM) 5 % 1 patch (1 patch Transdermal Patch Applied 11/23/23 1308)  oxyCODONE-acetaminophen (PERCOCET/ROXICET) 5-325 MG per tablet 1 tablet (has no administration in time range)  HYDROcodone-acetaminophen (NORCO/VICODIN) 5-325 MG per tablet 2 tablet (2 tablets Oral Given 11/23/23 1307)  cyclobenzaprine (FLEXERIL) tablet 5 mg (5 mg Oral Given 11/23/23 1306)  ketorolac (TORADOL) injection 30 mg (30 mg Intramuscular Given 11/23/23 1349)  Final Clinical Impression(s) / ED Diagnoses Final diagnoses:  Strain of lumbar region, initial encounter    Rx / DC Orders ED Discharge Orders          Ordered    oxyCODONE-acetaminophen (PERCOCET/ROXICET) 5-325 MG tablet  Every 6 hours PRN        11/23/23 1456    methocarbamol (ROBAXIN) 500 MG tablet  2 times daily        11/23/23 1456    lidocaine (LIDODERM) 5 %  Every 24 hours        11/23/23 1456              Ernie Avena, MD 11/23/23 1500

## 2023-11-23 NOTE — ED Triage Notes (Signed)
Pt c/o back pain (middle down) since yesterday s/p falling in a dip in the ground while carrying firewood

## 2023-11-23 NOTE — Discharge Instructions (Addendum)
Your CT imaging was negative for fracture, did not show any significant spinal narrowing and no clear evidence of degeneration in your lumbar disks.  You are neurologically intact.  You have tenderness along the left side of your spine and the paraspinal soft tissues consistent with likely muscular strain.  Recommend multimodal pain control outpatient, caution with NSAIDs given your history of gastroparesis however a short course should be fine, Flexeril, opiates for breakthrough pain.  Rest, apply heating pad as needed, ice to the low back for the next 24 hours.

## 2024-01-01 IMAGING — MG MM DIGITAL DIAGNOSTIC UNILAT*R* W/ TOMO W/ CAD
4 series · 4 of 12 positions shown · non-contrast
Comparison: Previous exam(s).

CLINICAL DATA: Screening recall for possible right breast
asymmetry.

EXAM:
DIGITAL DIAGNOSTIC UNILATERAL RIGHT MAMMOGRAM WITH TOMOSYNTHESIS AND
CAD; ULTRASOUND RIGHT BREAST LIMITED
TECHNIQUE: Right digital diagnostic mammography and breast tomosynthesis was
performed. The images were evaluated with computer-aided detection.;
Targeted ultrasound examination of the right breast was performed

[R ML synth-2D]
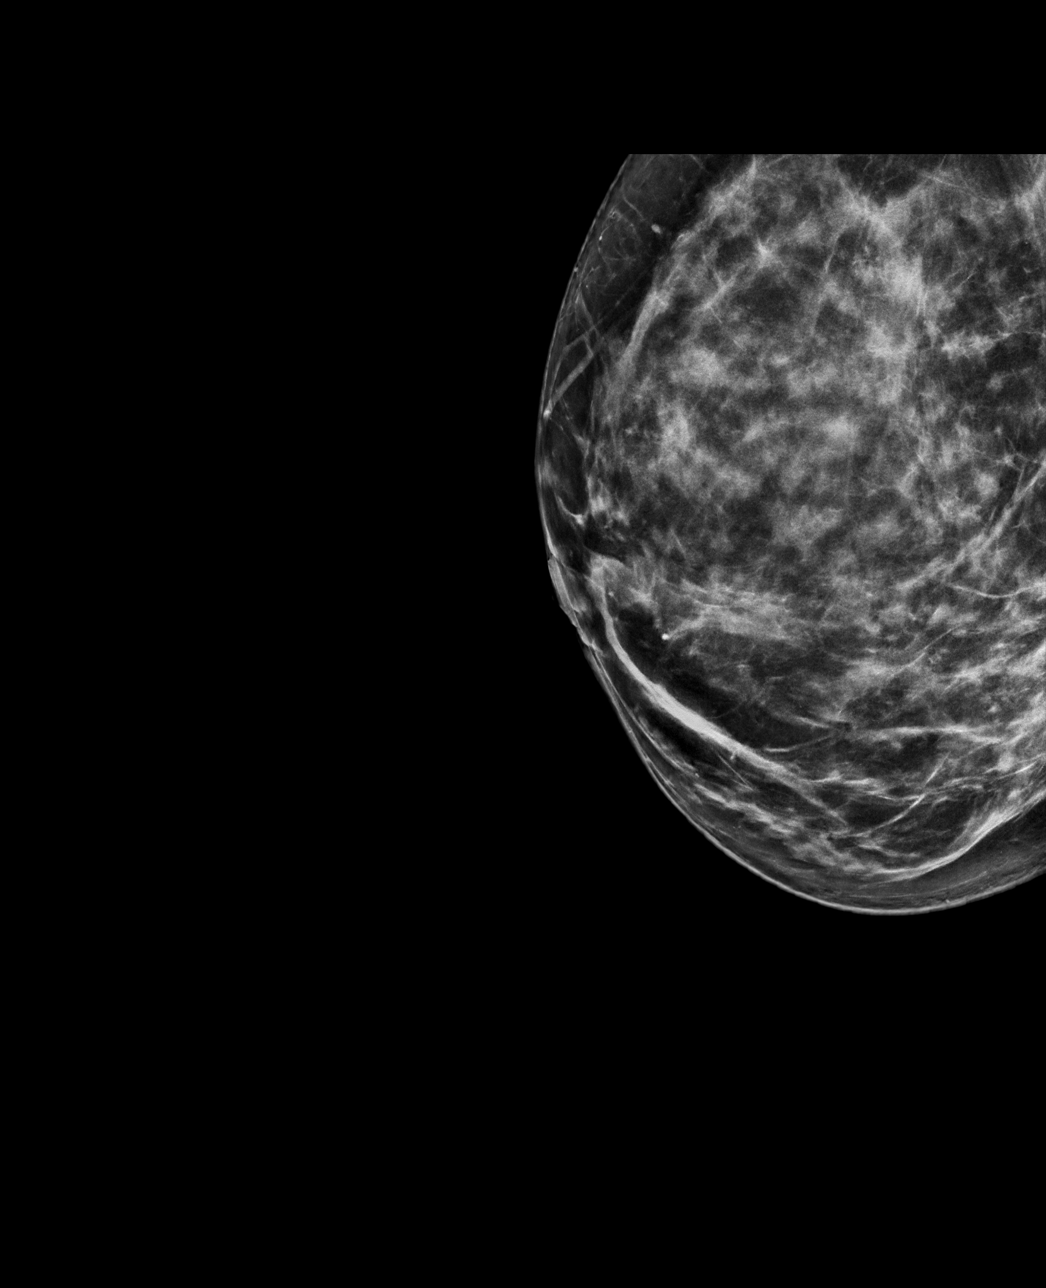

[R CC synth-2D]
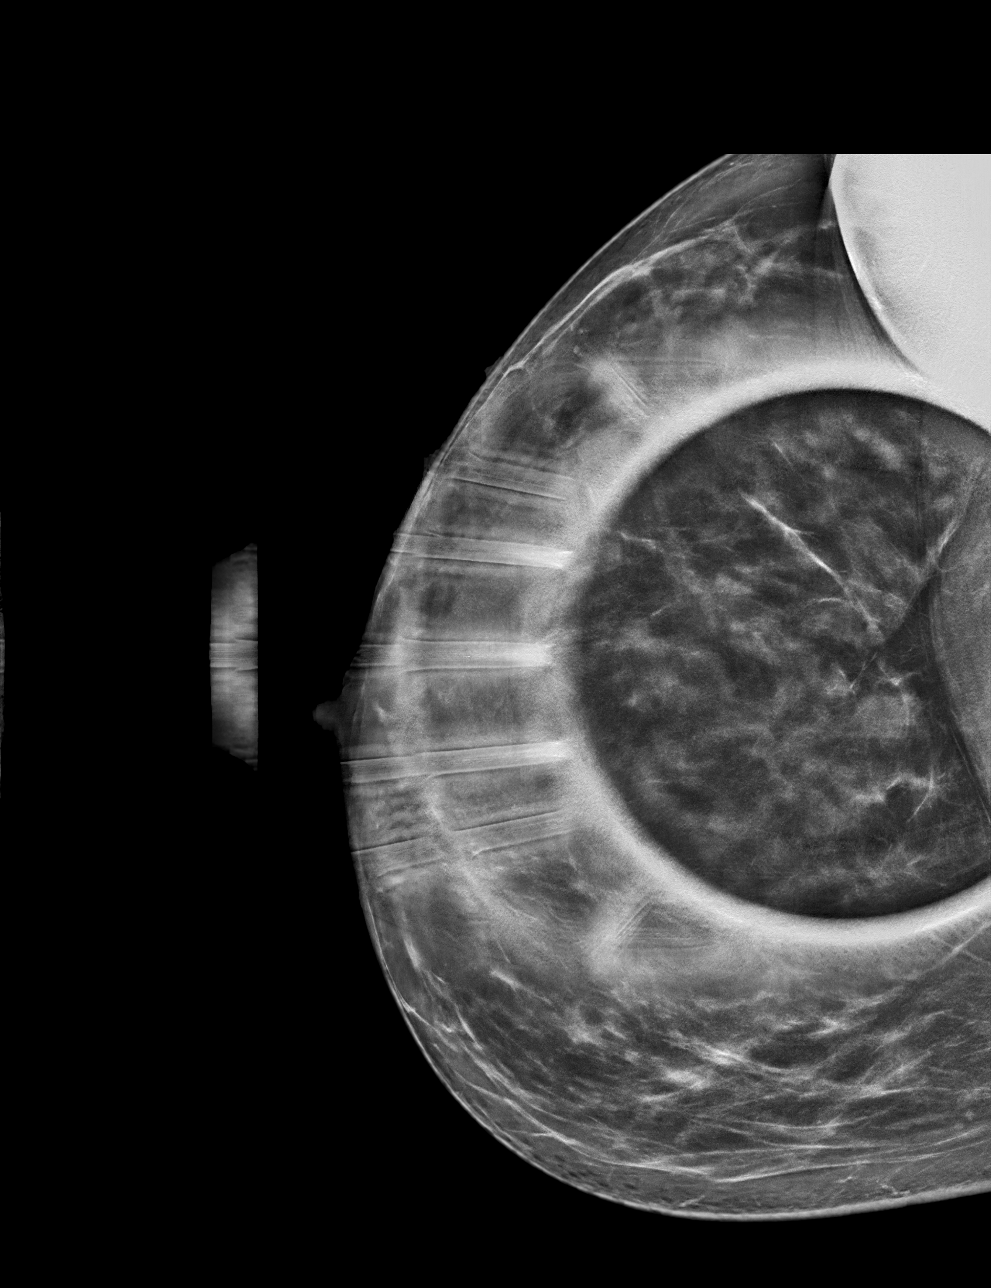

[R ML tomo · tomo slice 41/81.0]
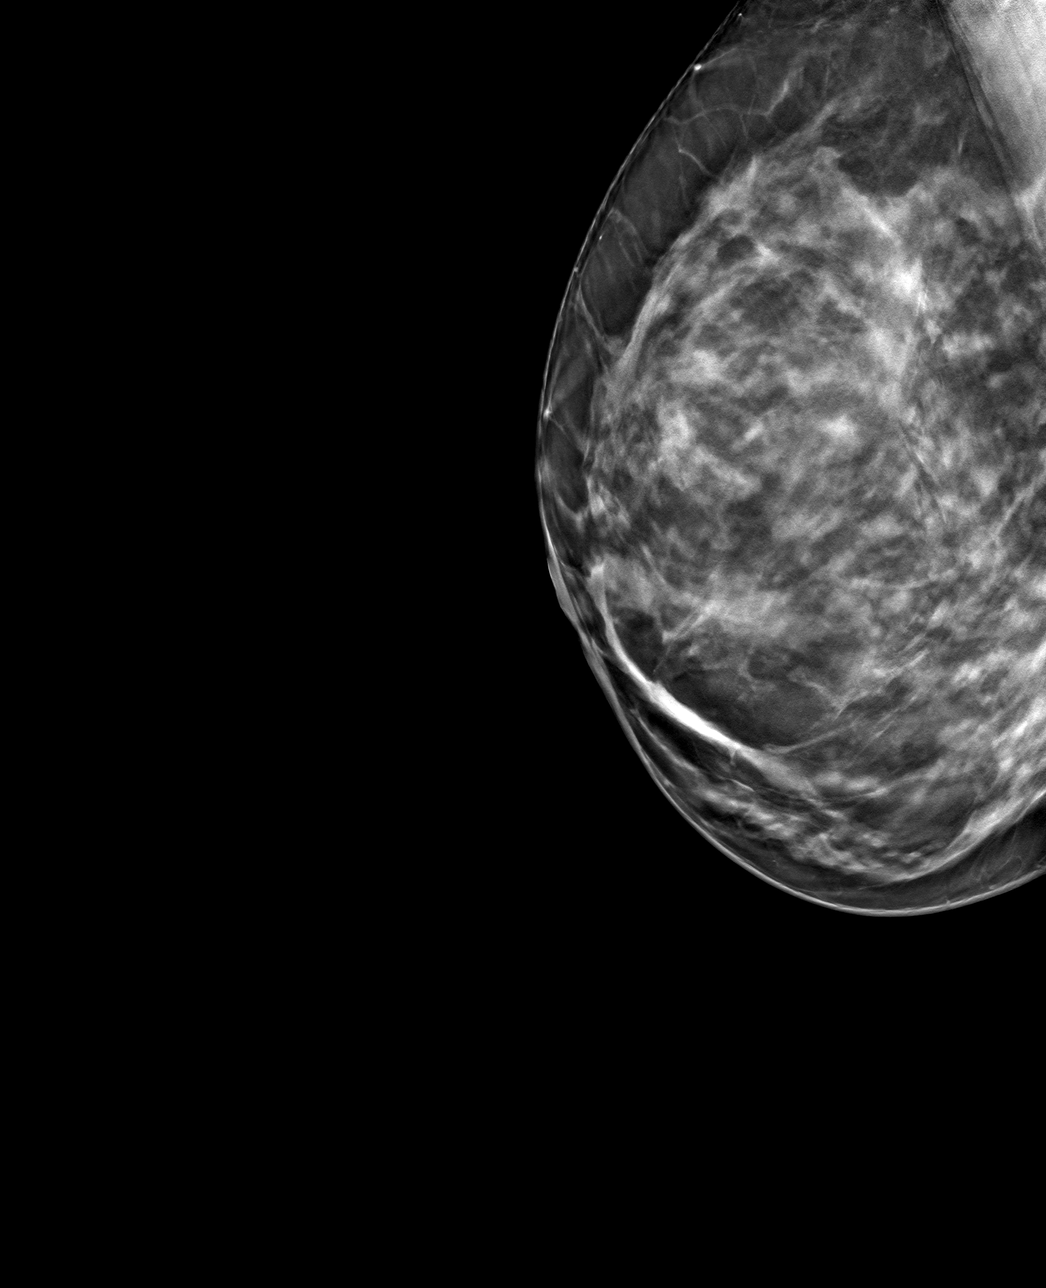

[R CC tomo · tomo slice 45/88.0]
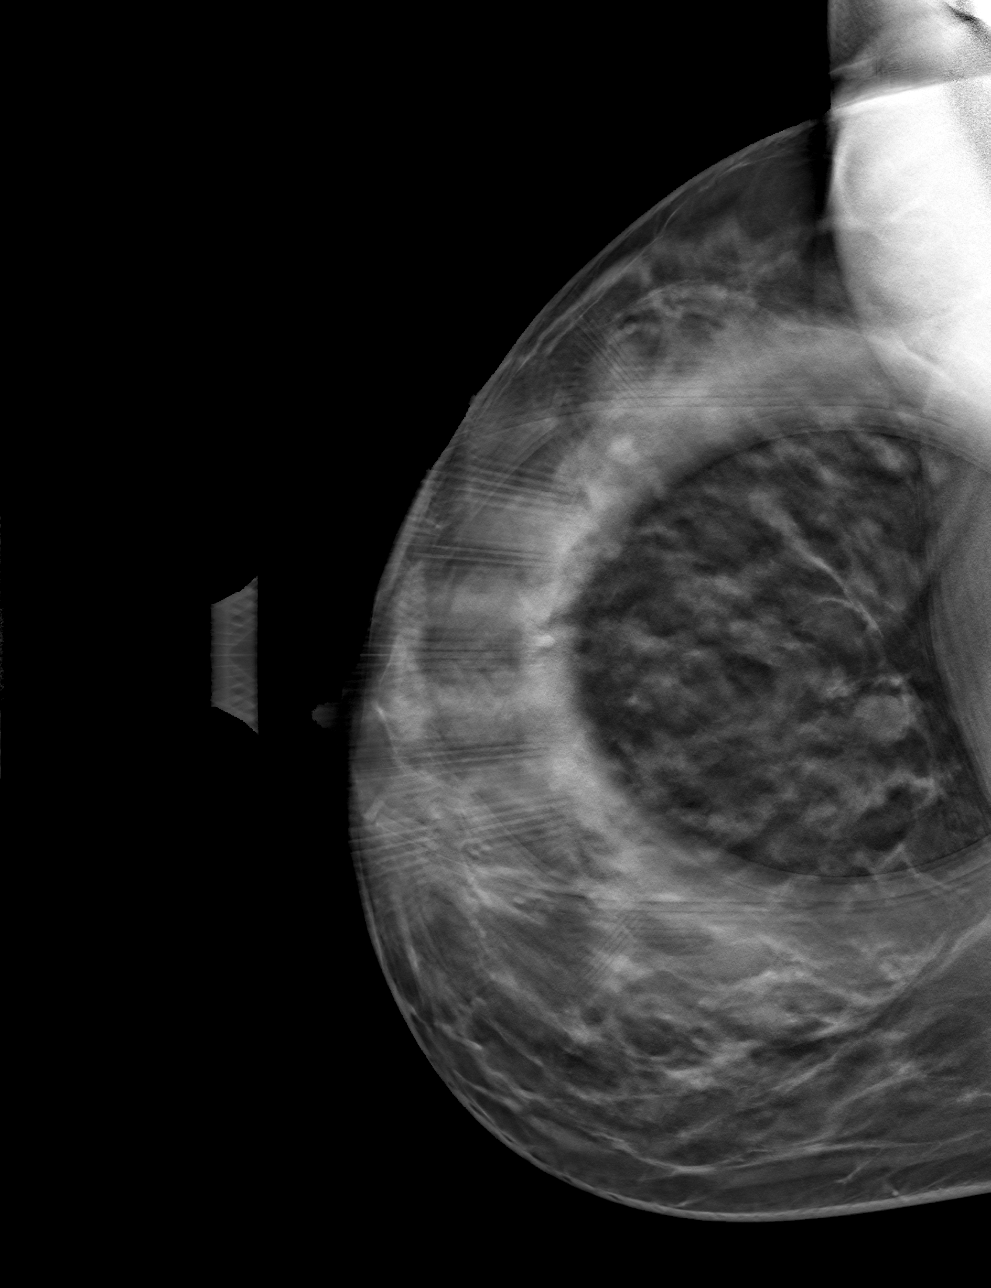

[4 of 12 positions shown; findings below may reference images not displayed]

ACR Breast Density Category d: The breast tissue is extremely dense,
which lowers the sensitivity of mammography.
FINDINGS: Additional tomograms were performed of the right breast. There is a
persistent asymmetry in the far central posterior right breast seen
on the cc images only, possibly related to an area of focal
fibroglandular tissue.

Targeted ultrasound of the upper and entire central right breast was
performed demonstrating dense fibroglandular tissue with scattered
areas of fibrocystic change. A cyst in the right breast at 12
o'clock 1 cm from the nipple measures 0.6 x 0.4 x 0.6 cm. This may
but not definitely correspond with the asymmetry seen in the right
breast at mammography. No suspicious masses or any other concerning
abnormality seen sonographically in the superior or central right
breast.
IMPRESSION: Probably benign right breast asymmetry.

RECOMMENDATION:
Recommend six-month follow-up diagnostic mammography with possible
ultrasound of the right breast to demonstrate stability of the
probably benign right breast asymmetry.

I have discussed the findings and recommendations with the patient.
If applicable, a reminder letter will be sent to the patient
regarding the next appointment.

BI-RADS CATEGORY  3: Probably benign.

## 2024-01-16 ENCOUNTER — Other Ambulatory Visit: Payer: Self-pay | Admitting: Obstetrics and Gynecology

## 2024-01-16 DIAGNOSIS — N6489 Other specified disorders of breast: Secondary | ICD-10-CM

## 2024-02-08 ENCOUNTER — Ambulatory Visit
Admission: RE | Admit: 2024-02-08 | Discharge: 2024-02-08 | Disposition: A | Discharge status: Home / Self Care | Payer: PRIVATE HEALTH INSURANCE | Source: Ambulatory Visit | Attending: Obstetrics and Gynecology | Admitting: Obstetrics and Gynecology

## 2024-02-08 DIAGNOSIS — N6489 Other specified disorders of breast: Secondary | ICD-10-CM

## 2024-04-02 ENCOUNTER — Emergency Department (HOSPITAL_BASED_OUTPATIENT_CLINIC_OR_DEPARTMENT_OTHER)
Admission: EM | Admit: 2024-04-02 | Discharge: 2024-04-03 | Disposition: A | Discharge status: Home / Self Care | Payer: PRIVATE HEALTH INSURANCE | Attending: Emergency Medicine | Admitting: Emergency Medicine

## 2024-04-02 ENCOUNTER — Other Ambulatory Visit: Payer: Self-pay

## 2024-04-02 ENCOUNTER — Emergency Department (HOSPITAL_BASED_OUTPATIENT_CLINIC_OR_DEPARTMENT_OTHER): Payer: PRIVATE HEALTH INSURANCE

## 2024-04-02 ENCOUNTER — Encounter (HOSPITAL_BASED_OUTPATIENT_CLINIC_OR_DEPARTMENT_OTHER): Payer: Self-pay | Admitting: Emergency Medicine

## 2024-04-02 DIAGNOSIS — R0789 Other chest pain: Secondary | ICD-10-CM

## 2024-04-02 DIAGNOSIS — R0689 Other abnormalities of breathing: Secondary | ICD-10-CM | POA: Diagnosis not present

## 2024-04-02 DIAGNOSIS — R079 Chest pain, unspecified: Secondary | ICD-10-CM | POA: Diagnosis present

## 2024-04-02 LAB — HEPATIC FUNCTION PANEL
ALT: 11 U/L (ref 0–44)
AST: 16 U/L (ref 15–41)
Albumin: 4.1 g/dL (ref 3.5–5.0)
Alkaline Phosphatase: 56 U/L (ref 38–126)
Bilirubin, Direct: <0.1 mg/dL (ref 0.0–0.2)
Total Bilirubin: <0.2 mg/dL (ref 0.0–1.2)
Total Protein: 7.4 g/dL (ref 6.5–8.1)

## 2024-04-02 LAB — CBC
HCT: 36.6 % (ref 36.0–46.0)
Hemoglobin: 12.4 g/dL (ref 12.0–15.0)
MCH: 29.9 pg (ref 26.0–34.0)
MCHC: 33.9 g/dL (ref 30.0–36.0)
MCV: 88.2 fL (ref 80.0–100.0)
Platelets: 268 10*3/uL (ref 150–400)
RBC: 4.15 MIL/uL (ref 3.87–5.11)
RDW: 13.9 % (ref 11.5–15.5)
WBC: 8.1 10*3/uL (ref 4.0–10.5)
nRBC: 0 % (ref 0.0–0.2)

## 2024-04-02 LAB — BASIC METABOLIC PANEL WITH GFR
Anion gap: 12 (ref 5–15)
BUN: 18 mg/dL (ref 6–20)
CO2: 23 mmol/L (ref 22–32)
Calcium: 9.5 mg/dL (ref 8.9–10.3)
Chloride: 102 mmol/L (ref 98–111)
Creatinine, Ser: 1.05 mg/dL — ABNORMAL HIGH (ref 0.44–1.00)
GFR, Estimated: >60 mL/min (ref >60)
Glucose, Bld: 89 mg/dL (ref 70–99)
Potassium: 4.1 mmol/L (ref 3.5–5.1)
Sodium: 137 mmol/L (ref 135–145)

## 2024-04-02 LAB — TROPONIN T, HIGH SENSITIVITY
Troponin T High Sensitivity: <15 ng/L (ref <19)
Troponin T High Sensitivity: <15 ng/L (ref <19)

## 2024-04-02 LAB — D-DIMER, QUANTITATIVE: D-Dimer, Quant: 0.32 ug{FEU}/mL (ref 0.00–0.50)

## 2024-04-02 MED ORDER — LIDOCAINE 5 % EX PTCH
1.0000 | MEDICATED_PATCH | CUTANEOUS | Status: DC
  Administered 2024-04-02: 1 via TRANSDERMAL
  Filled 2024-04-02: qty 1

## 2024-04-02 MED ORDER — KETOROLAC TROMETHAMINE 15 MG/ML IJ SOLN
15.0000 mg | Freq: Once | INTRAMUSCULAR | Status: AC
  Administered 2024-04-02: 15 mg via INTRAVENOUS
  Filled 2024-04-02: qty 1

## 2024-04-02 MED ORDER — FENTANYL CITRATE PF 50 MCG/ML IJ SOSY: 25.0000 ug | PREFILLED_SYRINGE | Freq: Once | INTRAMUSCULAR | Status: DC

## 2024-04-02 MED ORDER — ACETAMINOPHEN 160 MG/5ML PO SOLN
1000.0000 mg | Freq: Once | ORAL | Status: AC
  Administered 2024-04-02: 1000 mg via ORAL
  Filled 2024-04-02: qty 40.6

## 2024-04-02 MED ORDER — ONDANSETRON HCL 4 MG/2ML IJ SOLN
4.0000 mg | Freq: Once | INTRAMUSCULAR | Status: AC
  Administered 2024-04-02: 4 mg via INTRAVENOUS
  Filled 2024-04-02: qty 2

## 2024-04-02 NOTE — ED Triage Notes (Signed)
 Pt POV unsteady gait- c/o mid chest pain since Sunday AM, radiating to R side, worse with taking deep breath.   Hx of gastroparesis.

## 2024-04-02 NOTE — ED Provider Notes (Signed)
 [place] [facility] Provider Note   CSN: [id number] Arrival date & time: 04/02/24  2100     History {Add pertinent medical, surgical, social history, OB history to HPI:1} Chief Complaint  Patient presents with   Chest Pain    Kelly Finley is a 43 y.o. female with PMH as listed below who presents with mid chest pain since Sunday AM, radiating to R side, worse with taking deep breath. Started when she was sleeping and woke her up. Rated 7/10 right now. Moving maes it worse. Hx of gastroparesis. No leg swelling, no recent travel, hospitalizations, surgeries, immobilizations. No h/o DVT/PE. No blood thinner. Endorses some tenderness to palpation. Some nausea but no vomiting. Patient's wife is ill with cough, flu-like sxs, but she hasn't had any cough. No recent strenuous activity.   Past Medical History:  Diagnosis Date   Gastroparesis    GERD (gastroesophageal reflux disease)        Home Medications Prior to Admission medications   Medication Sig Start Date End Date Taking? Authorizing Provider  lidocaine  (LIDODERM ) 5 % Place 1 patch onto the skin daily. Remove & Discard patch within 12 hours or as directed by MD 11/23/23   Rosealee Concha, MD  linaclotide Glory Larsen) 290 MCG CAPS capsule Take 290 mcg by mouth daily before breakfast. 01/11/22   [provider]  methocarbamol  (ROBAXIN ) 500 MG tablet Take 1 tablet (500 mg total) by mouth 2 (two) times daily. 11/23/23   Rosealee Concha, MD  oxyCODONE -acetaminophen  (PERCOCET/ROXICET) 5-325 MG tablet Take 1-2 tablets by mouth every 6 (six) hours as needed for severe pain (pain score 7-10). 11/23/23   Rosealee Concha, MD  Prucalopride Succinate (MOTEGRITY) 1 MG TABS Take by mouth.    [provider]      Allergies    Codeine, Penicillins, Shrimp [shellfish allergy ], and Ibuprofen    Review of Systems   Review of Systems A 10 point review of systems was performed and is negative  unless otherwise reported in HPI.  Physical Exam Updated Vital Signs BP 136/78   Pulse 63   Resp (!) 21   Ht 5\' 9"  (1.753 m)   Wt 90.3 kg   SpO2 100%   BMI 29.39 kg/m  Physical Exam General: Normal appearing female, lying in bed.  HEENT: PERRLA, Sclera anicteric, MMM, trachea midline.  Cardiology: RRR, no murmurs/rubs/gallops. TTP to the left anterior chest wall, parasternally.   Resp: Normal respiratory rate and effort. CTAB, no wheezes, rhonchi, crackles.  Abd: Soft, non-tender, non-distended. No rebound tenderness or guarding.  GU: Deferred. MSK: No peripheral edema or signs of trauma.  Skin: warm, dry.  Neuro: A&Ox4, CNs II-XII grossly intact. MAEs. Sensation grossly intact.  Psych: Normal mood and affect.   ED Results / Procedures / Treatments   Labs (all labs ordered are listed, but only abnormal results are displayed) Labs Reviewed  CBC  BASIC METABOLIC PANEL WITH GFR  TROPONIN T, HIGH SENSITIVITY    EKG EKG Interpretation Date/Time:  Monday Apr 02 2024 21:17:24 EDT Ventricular Rate:  63 PR Interval:  172 QRS Duration:  81 QT Interval:  422 QTC Calculation: 432 R Axis:   81  Text Interpretation: Sinus rhythm Confirmed by Annita Kindle [id number]) on 04/02/2024 9:23:51 PM  Radiology DG Chest 2 View Result Date: 04/02/2024 CLINICAL DATA:  chest pain EXAM: CHEST - 2 VIEW COMPARISON:  June 15, 2021 FINDINGS: Low lung volumes. No focal airspace consolidation, pleural effusion, or pneumothorax. No cardiomegaly.  No acute fracture or destructive lesion. IMPRESSION: No acute cardiopulmonary abnormality. Electronically Signed   By: Rance Burrows M.D.   On: 04/02/2024 22:03    Procedures Procedures  {Document cardiac monitor, telemetry assessment procedure when appropriate:1}  Medications Ordered in ED Medications - No data to display  ED Course/ Medical Decision Making/ A&P                          Medical Decision Making Amount and/or Complexity of Data  Reviewed Labs: ordered. Decision-making details documented in ED Course. Radiology: ordered. Decision-making details documented in ED Course.  Risk Prescription drug management.    This patient presents to the ED for concern of ***, this involves an extensive number of treatment options, and is a complaint that carries with it a high risk of complications and morbidity.  I considered the following differential and admission for this acute, potentially life threatening condition.   MDM:    DDX for chest pain includes but is not limited to:  ACS/arrhythmia,  PE, aortic dissection, PNA, PTX, esophogeal rupture, biliary disease, cardiac tamponade, pericarditis, GERD/PUD/gastritis, or musculoskeletal pain. Very low suspicion for ACS vs aortic dissection given presenting sx. Patient cannot PERC out based on tachycardia, but will obtain D dimer and reassess, minimal risk factors for PE. No c/f dissection. No abdominal pain and no c/f biliary disease. High degree of concern for musculoskeletal pain given tenderness to palpation in left anterior sternal border, such as costochondritis.    Clinical Course as of 04/02/24 2230  Mon Apr 02, 2024  2230 CBC wnl [HN]  2230 DG Chest 2 View No acute cardiopulmonary abnormality. [HN]  2230 Troponin T High Sensitivity: <15 neg [HN]    Clinical Course User Index [HN] Merdis Stalling, MD    Labs: I Ordered, and personally interpreted labs.  The pertinent results include:  those listed above  Imaging Studies ordered: I ordered imaging studies including CXR I independently visualized and interpreted imaging. I agree with the radiologist interpretation  Additional history obtained from chart review.    Cardiac Monitoring: The patient was maintained on a cardiac monitor.  I personally viewed and interpreted the cardiac monitored which showed an underlying rhythm of: NSR  Reevaluation: After the interventions noted above, I reevaluated the patient  and found that they have :{resolved/improved/worsened:23923::"improved"}  Social Determinants of Health: Lives independently  Disposition:  ***  Co morbidities that complicate the patient evaluation  Past Medical History:  Diagnosis Date   Gastroparesis    GERD (gastroesophageal reflux disease)      Medicines No orders of the defined types were placed in this encounter.   I have reviewed the patients home medicines and have made adjustments as needed  Problem List / ED Course: Problem List Items Addressed This Visit   None        {Document critical care time when appropriate:1} {Document review of labs and clinical decision tools ie heart score, Chads2Vasc2 etc:1}  {Document your independent review of radiology images, and any outside records:1} {Document your discussion with family members, caretakers, and with consultants:1} {Document social determinants of health affecting pt's care:1} {Document your decision making why or why not admission, treatments were needed:1}  This note was created using dictation software, which may contain spelling or grammatical errors.

## 2024-04-03 NOTE — ED Provider Notes (Signed)
 Care of the patient assumed at shift change. Here for atypical and reproducible chest pain. Awaiting delta trop  Physical Exam  BP 111/65   Pulse 68   Resp 15   Ht 5\' 9"  (1.753 m)   Wt 90.3 kg   SpO2 99%   BMI 29.39 kg/m   Physical Exam  Procedures  Procedures  ED Course / MDM   Clinical Course as of 04/03/24 0021  Mon Apr 02, 2024  2230 CBC wnl [HN]  2230 DG Chest 2 View No acute cardiopulmonary abnormality. [HN]  2230 Troponin T High Sensitivity: <15 neg [HN]  Tue Apr 03, 2024  0018 Repeat trop remains normal. Patient reports pain improving after toradol  and she is comfortable going home. Recommend OTC pain meds, rest, PCP follow up, RTED for any other concerns.   [CS]    Clinical Course User Index [CS] Charmayne Cooper, MD [HN] Merdis Stalling, MD   Medical Decision Making Problems Addressed: Chest wall pain: acute illness or injury  Amount and/or Complexity of Data Reviewed Labs:  Decision-making details documented in ED Course.  Risk OTC drugs. Prescription drug management.          Charmayne Cooper, MD 04/03/24 [phone]

## 2024-08-05 ENCOUNTER — Other Ambulatory Visit: Payer: Self-pay

## 2024-08-05 ENCOUNTER — Encounter (HOSPITAL_BASED_OUTPATIENT_CLINIC_OR_DEPARTMENT_OTHER): Payer: Self-pay

## 2024-08-05 ENCOUNTER — Emergency Department (HOSPITAL_BASED_OUTPATIENT_CLINIC_OR_DEPARTMENT_OTHER)
Admission: EM | Admit: 2024-08-05 | Discharge: 2024-08-05 | Disposition: A | Discharge status: Home / Self Care | Payer: PRIVATE HEALTH INSURANCE

## 2024-08-05 DIAGNOSIS — M545 Low back pain, unspecified: Secondary | ICD-10-CM | POA: Insufficient documentation

## 2024-08-05 DIAGNOSIS — T148XXA Other injury of unspecified body region, initial encounter: Secondary | ICD-10-CM

## 2024-08-05 MED ORDER — ACETAMINOPHEN 325 MG PO TABS
650.0000 mg | ORAL_TABLET | Freq: Once | ORAL | Status: AC
  Administered 2024-08-05: 650 mg via ORAL
  Filled 2024-08-05: qty 2

## 2024-08-05 MED ORDER — METHOCARBAMOL 500 MG PO TABS
500.0000 mg | ORAL_TABLET | Freq: Once | ORAL | Status: AC
  Administered 2024-08-05: 500 mg via ORAL
  Filled 2024-08-05: qty 1

## 2024-08-05 MED ORDER — METHOCARBAMOL 500 MG PO TABS: 500.0000 mg | ORAL_TABLET | Freq: Two times a day (BID) | ORAL | 0 refills | Status: DC | PRN

## 2024-08-05 MED ORDER — DEXAMETHASONE 4 MG PO TABS
10.0000 mg | ORAL_TABLET | Freq: Once | ORAL | Status: AC
  Administered 2024-08-05: 10 mg via ORAL
  Filled 2024-08-05: qty 3

## 2024-08-05 MED ORDER — GABAPENTIN 300 MG PO CAPS: 300.0000 mg | ORAL_CAPSULE | Freq: Two times a day (BID) | ORAL | 0 refills | Status: AC

## 2024-08-05 MED ORDER — GABAPENTIN 300 MG PO CAPS
300.0000 mg | ORAL_CAPSULE | Freq: Once | ORAL | Status: AC
  Administered 2024-08-05: 300 mg via ORAL
  Filled 2024-08-05: qty 1

## 2024-08-05 MED ORDER — OXYCODONE HCL 5 MG PO TABS
5.0000 mg | ORAL_TABLET | Freq: Once | ORAL | Status: AC
  Administered 2024-08-05: 5 mg via ORAL
  Filled 2024-08-05: qty 1

## 2024-08-05 MED ORDER — LIDOCAINE 5 % EX PTCH
1.0000 | MEDICATED_PATCH | Freq: Once | CUTANEOUS | Status: DC
  Filled 2024-08-05: qty 1

## 2024-08-05 NOTE — ED Triage Notes (Addendum)
 Says she was leaning over to clean out the fridge and felt a pop. Instantly began having midline lower back pain.   Took an oxycodone  ~5pm. Says she received the same medication last time when this happened.   Denies any bowel or bladder changes.

## 2024-08-05 NOTE — Discharge Instructions (Signed)
 Please offer the primary doctor.  May take over-the-counter med pain medication such as Tylenol  for pain.  You may use the lidocaine  patches you have at home as well as the muscle relaxers and gabapentin  we are prescribing you for further pain relief.  Please follow-up with your primary doctor.  Return for fevers, chills, worsening pain, numbness in your legs, numbness in your genital area, bowel or bladder incontinence, or you develop any new or worsening symptoms that are concerning to you.  You may also try gentle massage, stretching as well as heat therapy.

## 2024-08-05 NOTE — ED Notes (Signed)
 Pt states she was leaning over to clean refrigerator and felt a pop and intense painimmediately.  Applied a Lidocaine  patch and took an oxycodone  after it happened without relief

## 2024-08-05 NOTE — ED Provider Notes (Signed)
 [place] [facility] Provider Note   CSN: [id number] Arrival date & time: 08/05/24  1853     Patient presents with: Back Pain   Kelly Finley is a 43 y.o. female.   Is a 43 year old female presenting emergency department for back pain.  Was leaning over to clean her refrigerator when she said she felt a crack in her back and then started having pain.  Diffusely across low back but primarily to the left lower.  No history of low back pain.  Had similar episode years ago that resolved spontaneously after a week.  No weakness in her lower extremities.  No numbness in her genital area.  No bowel or bladder incontinence.  No immunosuppression.  No history of cancer.  No IV drug use history.   Back Pain      Prior to Admission medications   Medication Sig Start Date End Date Taking? Authorizing Provider  lidocaine  (LIDODERM ) 5 % Place 1 patch onto the skin daily. Remove & Discard patch within 12 hours or as directed by MD 11/23/23   Jerrol Agent, MD  linaclotide LARUE) 290 MCG CAPS capsule Take 290 mcg by mouth daily before breakfast. 01/11/22   [provider]  methocarbamol  (ROBAXIN ) 500 MG tablet Take 1 tablet (500 mg total) by mouth 2 (two) times daily. 11/23/23   Jerrol Agent, MD  oxyCODONE -acetaminophen  (PERCOCET/ROXICET) 5-325 MG tablet Take 1-2 tablets by mouth every 6 (six) hours as needed for severe pain (pain score 7-10). 11/23/23   Jerrol Agent, MD  Prucalopride Succinate (MOTEGRITY) 1 MG TABS Take by mouth.    [provider]    Allergies: Codeine, Penicillins, Shrimp [shellfish allergy ], and Ibuprofen    Review of Systems  Musculoskeletal:  Positive for back pain.    Updated Vital Signs BP (!) 141/65 (BP Location: Left Arm)   Pulse 88   Temp 98.6 F (37 C) (Oral)   Resp (!) 22   Ht 5' 9 (1.753 m)   Wt 88.5 kg   SpO2 100%   BMI 28.80 kg/m   Physical Exam Vitals and nursing note reviewed.   Constitutional:      General: She is not in acute distress.    Appearance: She is not toxic-appearing.  HENT:     Head: Normocephalic.     Nose: Nose normal.     Mouth/Throat:     Mouth: Mucous membranes are moist.  Eyes:     Conjunctiva/sclera: Conjunctivae normal.  Cardiovascular:     Rate and Rhythm: Normal rate.     Pulses: Normal pulses.  Pulmonary:     Effort: Pulmonary effort is normal.  Musculoskeletal:     Comments: No midline spinal tenderness.  Does have some tenderness diffusely across her lumbar musculature primarily to the left iliac crest.  5 out of 5 plantarflexion dorsiflexion.  Knee extension and hip flexion.  Normal sensation.  Warm and well-perfused extremities  Skin:    General: Skin is warm and dry.     Capillary Refill: Capillary refill takes less than 2 seconds.  Neurological:     Mental Status: She is alert and oriented to person, place, and time.  Psychiatric:        Mood and Affect: Mood normal.        Behavior: Behavior normal.     (all labs ordered are listed, but only abnormal results are displayed) Labs Reviewed - No data to display  EKG: None  Radiology: No results  found.   Procedures   Medications Ordered in the ED  lidocaine  (LIDODERM ) 5 % 1 patch (1 patch Transdermal Not Given 08/05/24 2053)  acetaminophen  (TYLENOL ) tablet 650 mg (650 mg Oral Given 08/05/24 2049)  methocarbamol  (ROBAXIN ) tablet 500 mg (500 mg Oral Given 08/05/24 2049)  dexamethasone  (DECADRON ) tablet 10 mg (10 mg Oral Given 08/05/24 2115)  oxyCODONE  (Oxy IR/ROXICODONE ) immediate release tablet 5 mg (5 mg Oral Given 08/05/24 2116)  gabapentin  (NEURONTIN ) capsule 300 mg (300 mg Oral Given 08/05/24 2115)                                    Medical Decision Making This is a 43 year old female presenting emergency department with low back pain.  No red flags on history or physical to warrant advanced imaging.  Low suspicion for cauda equina or acute cord compression based off  history physical exam.  Treated with multimodal pain medication with some improvement of her symptoms.  Will discharge with medications and PCP follow-up.  Return precautions given.  Risk OTC drugs. Prescription drug management.       Final diagnoses:  None    ED Discharge Orders     None          Neysa Caron PARAS, DO 08/05/24 2200

## 2024-08-09 ENCOUNTER — Other Ambulatory Visit: Payer: Self-pay

## 2024-08-09 ENCOUNTER — Emergency Department (HOSPITAL_BASED_OUTPATIENT_CLINIC_OR_DEPARTMENT_OTHER)
Admission: EM | Admit: 2024-08-09 | Discharge: 2024-08-09 | Disposition: A | Discharge status: Home / Self Care | Payer: PRIVATE HEALTH INSURANCE | Attending: Emergency Medicine | Admitting: Emergency Medicine

## 2024-08-09 ENCOUNTER — Encounter (HOSPITAL_BASED_OUTPATIENT_CLINIC_OR_DEPARTMENT_OTHER): Payer: Self-pay | Admitting: Emergency Medicine

## 2024-08-09 DIAGNOSIS — M545 Low back pain, unspecified: Secondary | ICD-10-CM | POA: Insufficient documentation

## 2024-08-09 MED ORDER — OXYCODONE HCL 5 MG PO TABS
5.0000 mg | ORAL_TABLET | Freq: Once | ORAL | Status: AC
  Administered 2024-08-09: 5 mg via ORAL
  Filled 2024-08-09: qty 1

## 2024-08-09 MED ORDER — METHYLPREDNISOLONE 4 MG PO TBPK: ORAL_TABLET | ORAL | 0 refills | Status: DC

## 2024-08-09 MED ORDER — DIAZEPAM 5 MG PO TABS
5.0000 mg | ORAL_TABLET | Freq: Once | ORAL | Status: AC
  Administered 2024-08-09: 5 mg via ORAL
  Filled 2024-08-09: qty 1

## 2024-08-09 MED ORDER — KETOROLAC TROMETHAMINE 15 MG/ML IJ SOLN
15.0000 mg | Freq: Once | INTRAMUSCULAR | Status: AC
  Administered 2024-08-09: 15 mg via INTRAMUSCULAR
  Filled 2024-08-09: qty 1

## 2024-08-09 MED ORDER — CYCLOBENZAPRINE HCL 10 MG PO TABS
10.0000 mg | ORAL_TABLET | Freq: Two times a day (BID) | ORAL | 0 refills | Status: DC | PRN
Start: 2024-08-09 — End: 2024-09-19

## 2024-08-09 MED ORDER — ACETAMINOPHEN 500 MG PO TABS
1000.0000 mg | ORAL_TABLET | Freq: Once | ORAL | Status: AC
  Administered 2024-08-09: 1000 mg via ORAL
  Filled 2024-08-09: qty 2

## 2024-08-09 MED ORDER — DICLOFENAC SODIUM 1 % EX GEL: 4.0000 g | Freq: Four times a day (QID) | CUTANEOUS | 0 refills | Status: AC

## 2024-08-09 NOTE — Discharge Instructions (Addendum)
 Your back pain is most likely due to a muscular strain.  There is been a lot of research on back pain, unfortunately the only thing that seems to really help is Tylenol  and ibuprofen.  Relative rest is also important to not lift greater than 10 pounds bending or twisting at the waist.  Please follow-up with your family physician.  The other thing that really seems to benefit patients is physical therapy which your doctor may send you for.  Please return to the emergency department for new numbness or weakness to your arms or legs. Difficulty with urinating or urinating or pooping on yourself.  Also if you cannot feel toilet paper when you wipe or get a fever.   Take the steroids as prescribed, use the gel as prescribed.  I did prescribe you a different muscle relaxer if you want to stop the Robaxin (methocarbamol ) and try this instead.  Also take tylenol  1000mg (2 extra strength) four times a day.   Stretches can also help try. https://www.youtube.com/watch?v=[email or web address]

## 2024-08-09 NOTE — ED Provider Notes (Signed)
 [place] [facility] Provider Note   CSN: [id number] Arrival date & time: 08/09/24  0747     Patient presents with: Back Pain (RIGHT LOWER)   Glennie D Quevedo is a 43 y.o. female.   43 yo F with a chief complaint of left-sided low back pain.  Patient was actually seen about 3 days ago for the same.  She was taking water out of her refrigerator and felt a pop in her back.  Felt a little bit better over the next 24 to 48 hours after being seen in the ED but had developed worsening pain.  She had started a new job this week and has been going regularly.  She denies any injury denies loss of bowel or bladder denies loss of rectal sensation denies numbness or weakness of the leg.  She said just over where it hurts on her left low back she has a small area that she feels like she has trouble feeling.   Back Pain      Prior to Admission medications   Medication Sig Start Date End Date Taking? Authorizing Provider  cyclobenzaprine  (FLEXERIL ) 10 MG tablet Take 1 tablet (10 mg total) by mouth 2 (two) times daily as needed for muscle spasms. 08/09/24  Yes Emil Share, DO  diclofenac  Sodium (VOLTAREN ) 1 % GEL Apply 4 g topically 4 (four) times daily. 08/09/24  Yes Emil Share, DO  methylPREDNISolone  (MEDROL  DOSEPAK) 4 MG TBPK tablet Day 1: 8mg  before breakfast, 4 mg after lunch, 4 mg after supper, and 8 mg at bedtime Day 2: 4 mg before breakfast, 4 mg after lunch, 4 mg  after supper, and 8 mg  at bedtime Day 3:  4 mg  before breakfast, 4 mg  after lunch, 4 mg after supper, and 4 mg  at bedtime Day 4: 4 mg  before breakfast, 4 mg  after lunch, and 4 mg at bedtime Day 5: 4 mg  before breakfast and 4 mg at bedtime Day 6: 4 mg  before breakfast 08/09/24  Yes Avinash Maltos, DO  gabapentin  (NEURONTIN ) 300 MG capsule Take 1 capsule (300 mg total) by mouth 2 (two) times daily for 10 days. 08/05/24 08/15/24  Neysa Caron PARAS, DO  lidocaine  (LIDODERM ) 5 % Place 1 patch onto the skin  daily. Remove & Discard patch within 12 hours or as directed by MD 11/23/23   Jerrol Agent, MD  linaclotide LARUE) 290 MCG CAPS capsule Take 290 mcg by mouth daily before breakfast. 01/11/22   [provider]  methocarbamol  (ROBAXIN ) 500 MG tablet Take 1 tablet (500 mg total) by mouth 2 (two) times daily as needed for muscle spasms. 08/05/24   Neysa Caron PARAS, DO  oxyCODONE -acetaminophen  (PERCOCET/ROXICET) 5-325 MG tablet Take 1-2 tablets by mouth every 6 (six) hours as needed for severe pain (pain score 7-10). 11/23/23   Jerrol Agent, MD  Prucalopride Succinate (MOTEGRITY) 1 MG TABS Take by mouth.    [provider]    Allergies: Codeine, Penicillins, Shrimp [shellfish allergy ], and Ibuprofen    Review of Systems  Musculoskeletal:  Positive for back pain.    Updated Vital Signs BP 120/65   Pulse 71   Temp 98.2 F (36.8 C) (Oral)   Resp 20   SpO2 96%   Physical Exam Vitals and nursing note reviewed.  Constitutional:      General: She is not in acute distress.    Appearance: She is well-developed. She is not diaphoretic.  HENT:  Head: Normocephalic and atraumatic.  Eyes:     Pupils: Pupils are equal, round, and reactive to light.  Cardiovascular:     Rate and Rhythm: Normal rate and regular rhythm.     Heart sounds: No murmur heard.    No friction rub. No gallop.  Pulmonary:     Effort: Pulmonary effort is normal.     Breath sounds: No wheezing or rales.  Abdominal:     General: There is no distension.     Palpations: Abdomen is soft.     Tenderness: There is no abdominal tenderness.  Musculoskeletal:        General: No tenderness.     Cervical back: Normal range of motion and neck supple.     Comments: Pulse motor and sensation intact to left lower extremity.  Reflexes are 2+ and equal.  No clonus  No obvious midline spinal tenderness mild deformities.  Left lumbar paraspinal musculature tenderness and spasm.  Skin:    General: Skin is warm and  dry.  Neurological:     Mental Status: She is alert and oriented to person, place, and time.  Psychiatric:        Behavior: Behavior normal.     (all labs ordered are listed, but only abnormal results are displayed) Labs Reviewed - No data to display  EKG: None  Radiology: No results found.   Procedures   Medications Ordered in the ED  acetaminophen  (TYLENOL ) tablet 1,000 mg (has no administration in time range)  ketorolac  (TORADOL ) 15 MG/ML injection 15 mg (has no administration in time range)  oxyCODONE  (Oxy IR/ROXICODONE ) immediate release tablet 5 mg (has no administration in time range)  diazepam  (VALIUM ) tablet 5 mg (has no administration in time range)                                    Medical Decision Making Risk OTC drugs. Prescription drug management.   43 yo F with a chief complaints of left-sided low back pain.  This has been going on for about 4 days.  She has a history of back pain and has actually been seen in the ED at the onset of this pain and had been seen previously for back pain on my record review.  She unfortunately has an intolerance to ibuprofen and so has been unable to take NSAIDs at home.  She has been taking Goody powders, muscle relaxers and gabapentin .  She feels like Tylenol  does not work.  No red flags.  Reassuring exam.  I discussed supportive care with her.  Encouraged her to contact her PCP, given information for neurosurgery follow-up as she has had multiple ED visits for back pain in the past.  Trial of steroids diclofenac  gel.  8:27 AM:  I have discussed the diagnosis/risks/treatment options with the patient and family.  Evaluation and diagnostic testing in the emergency department does not suggest an emergent condition requiring admission or immediate intervention beyond what has been performed at this time.  They will follow up with PCP, neurosurgery. We also discussed returning to the ED immediately if new or worsening sx  occur. We discussed the sx which are most concerning (e.g., sudden worsening pain, fever, inability to tolerate by mouth, cauda equina s/sx) that necessitate immediate return. Medications administered to the patient during their visit and any new prescriptions provided to the patient are listed below.  Medications given during this visit Medications  acetaminophen  (TYLENOL ) tablet 1,000 mg (has no administration in time range)  ketorolac  (TORADOL ) 15 MG/ML injection 15 mg (has no administration in time range)  oxyCODONE  (Oxy IR/ROXICODONE ) immediate release tablet 5 mg (has no administration in time range)  diazepam  (VALIUM ) tablet 5 mg (has no administration in time range)     The patient appears reasonably screen and/or stabilized for discharge and I doubt any other medical condition or other [facility] requiring further screening, evaluation, or treatment in the ED at this time prior to discharge.       Final diagnoses:  Acute left-sided low back pain without sciatica    ED Discharge Orders          Ordered    methylPREDNISolone  (MEDROL  DOSEPAK) 4 MG TBPK tablet        08/09/24 0816    diclofenac  Sodium (VOLTAREN ) 1 % GEL  4 times daily        08/09/24 0816    cyclobenzaprine  (FLEXERIL ) 10 MG tablet  2 times daily PRN        08/09/24 0816               Anisah Kuck, DO 08/09/24 0827

## 2024-08-09 NOTE — ED Triage Notes (Signed)
 Persistent right lower back pain , obvious discomfort. Pain is radiating to left leg and left lower back .

## 2024-09-13 NOTE — Telephone Encounter (Signed)
 Copied from CRM #[id number]. Topic: Medication/Rx - Rx Clarification/Change - Patient Calling >> Sep 13, 2024  1:03 PM Cloyd MATSU wrote: Dinna, [facility] is calling for medication request (Obtain: Medication Name, Dosage, Quantity, Frequency, Quantity Requested (example: 30-day supply.)   Include all details related to the request(s) below:  Patient wanted to clarify usage on medication below, states it shows she shouldn't take them together but Dr. Bonney told her to.     .  estradioL (VIVELLE-DOT) 0.025 mg/24 hr transdermal patch, Place 1 patch on the skin 2 (two) times a week., Disp: 8 patch, Rfl: 2  .  progesterone (PROMETRIUM) 100 mg cap capsule, Take 1 capsule (100 mg total) by mouth at bedtime., Disp: 90 capsule, Rfl: 3  Confirm and type the Best Contact Number below:  Patient/caller contact number:          [phone]    [] Home  [x] Mobile  [] Work [] Other   [x] Okay to leave a voicemail   Medication List:  Current Outpatient Medications:  .  ascorbic acid (VITAMIN C) 500 mg tablet, as directed, Disp: , Rfl:  .  cyanocobalamin/folic acid (vitamin B12-folic acid) 1,000-400 mcg lozg, 1 tablet (Patient not taking: Reported on 09/07/2024), Disp: , Rfl:  .  dexAMETHasone  (DECADRON ) 1 mg tablet, Take 1 tablet at bedtime prior to having the labs next day on empty stomach., Disp: 1 tablet, Rfl: 0 .  diphenhydrAMINE  (BENADRYL ) 25 mg capsule, Take 25 mg by mouth nightly as needed for itching., Disp: 30 capsule, Rfl: 0 .  elderberry fruit and flower 460-115 mg cap, , Disp: , Rfl:  .  estradioL (VIVELLE-DOT) 0.025 mg/24 hr transdermal patch, Place 1 patch on the skin 2 (two) times a week., Disp: 8 patch, Rfl: 2 .  linaCLOtide (LINZESS) 290 mcg cap capsule, TAKE 1 CAPSULE(290 MCG) BY MOUTH DAILY, Disp: , Rfl:  .  linaCLOtide (Linzess) 290 mcg cap capsule, Take 1 capsule (290 mcg total) by mouth daily., Disp: 30 capsule, Rfl: 5 .  linaCLOtide (Linzess) 290 mcg cap capsule, Take one  capsule (290 mcg dose) by mouth daily., Disp: 7 capsule, Rfl: 0 .  loratadine (CLARITIN) 10 mg tablet, Take 10 mg by mouth Once Daily., Disp: , Rfl:  .  mirabegron (MYRBETRIQ) 50 mg Tb24, Take 1 tablet (50 mg total) by mouth daily. (Patient not taking: Reported on 06/11/2024), Disp: 30 tablet, Rfl: 6 .  multivitamin with folic acid (Quintabs) 400 mcg tab, Take 1 tablet by mouth Once Daily., Disp: , Rfl:  .  progesterone (PROMETRIUM) 100 mg cap capsule, Take 1 capsule (100 mg total) by mouth at bedtime., Disp: 90 capsule, Rfl: 3 .  prucalopride (MOTEGRITY) 2 mg tablet, Take 1 tablet by mouth daily., Disp: , Rfl:  .  rizatriptan MLT (MAXALT-MLT) 10 mg disintegrating tablet, Dissolve 1 tablet (10 mg total) on tongue once as needed for migraine for up to 2 doses. Every 2 hours, Disp: 9 tablet, Rfl: 0 .  topiramate (TOPAMAX) 25 mg tablet, Take 1 tablet (25 mg total) by mouth 2 (two) times a day., Disp: 60 tablet, Rfl: 0 .  triamcinolone  (KENALOG) 0.1 % cream, APP EXT AA BID (Patient not taking: Reported on 06/11/2024), Disp: , Rfl:  .  valACYclovir (VALTREX) 1 gram tablet, , Disp: , Rfl:      Medication Request/Refills: Pharmacy Information (if applicable)   [] Not Applicable       []  Pharmacy listed  Send Medication Request to:                                                 []   Pharmacy not listed (added to pharmacy list in Epic) Send Medication Request to:      Listed Pharmacies: [id number] - [place], [place] - [address] AT [facility] OF [address] - PHONE: [phone] - FAX: [phone]

## 2024-09-18 ENCOUNTER — Emergency Department (HOSPITAL_BASED_OUTPATIENT_CLINIC_OR_DEPARTMENT_OTHER)
Admission: EM | Admit: 2024-09-18 | Discharge: 2024-09-19 | Disposition: A | Discharge status: Home / Self Care | Payer: PRIVATE HEALTH INSURANCE | Source: Ambulatory Visit | Attending: Emergency Medicine | Admitting: Emergency Medicine

## 2024-09-18 ENCOUNTER — Emergency Department (HOSPITAL_BASED_OUTPATIENT_CLINIC_OR_DEPARTMENT_OTHER): Payer: PRIVATE HEALTH INSURANCE

## 2024-09-18 ENCOUNTER — Encounter (HOSPITAL_BASED_OUTPATIENT_CLINIC_OR_DEPARTMENT_OTHER): Payer: Self-pay

## 2024-09-18 ENCOUNTER — Other Ambulatory Visit: Payer: Self-pay

## 2024-09-18 DIAGNOSIS — K5901 Slow transit constipation: Secondary | ICD-10-CM | POA: Diagnosis not present

## 2024-09-18 DIAGNOSIS — R109 Unspecified abdominal pain: Secondary | ICD-10-CM

## 2024-09-18 DIAGNOSIS — R10A1 Flank pain, right side: Secondary | ICD-10-CM | POA: Diagnosis present

## 2024-09-18 LAB — CBC
HCT: 39.1 % (ref 36.0–46.0)
Hemoglobin: 13.2 g/dL (ref 12.0–15.0)
MCH: 29.9 pg (ref 26.0–34.0)
MCHC: 33.8 g/dL (ref 30.0–36.0)
MCV: 88.5 fL (ref 80.0–100.0)
Platelets: 280 K/uL (ref 150–400)
RBC: 4.42 MIL/uL (ref 3.87–5.11)
RDW: 14 % (ref 11.5–15.5)
WBC: 8.8 K/uL (ref 4.0–10.5)
nRBC: 0 % (ref 0.0–0.2)

## 2024-09-18 LAB — RESP PANEL BY RT-PCR (RSV, FLU A&B, COVID)  RVPGX2
Influenza A by PCR: NEGATIVE
Influenza B by PCR: NEGATIVE
Resp Syncytial Virus by PCR: NEGATIVE
SARS Coronavirus 2 by RT PCR: NEGATIVE

## 2024-09-18 LAB — URINALYSIS, ROUTINE W REFLEX MICROSCOPIC
Bilirubin Urine: NEGATIVE
Glucose, UA: NEGATIVE mg/dL
Hgb urine dipstick: NEGATIVE
Ketones, ur: NEGATIVE mg/dL
Leukocytes,Ua: NEGATIVE
Nitrite: NEGATIVE
Protein, ur: NEGATIVE mg/dL
Specific Gravity, Urine: 1.025 (ref 1.005–1.030)
pH: 6.5 (ref 5.0–8.0)

## 2024-09-18 LAB — PREGNANCY, URINE: Preg Test, Ur: NEGATIVE

## 2024-09-18 LAB — COMPREHENSIVE METABOLIC PANEL WITH GFR
ALT: 23 U/L (ref 0–44)
AST: 25 U/L (ref 15–41)
Albumin: 4.3 g/dL (ref 3.5–5.0)
Alkaline Phosphatase: 69 U/L (ref 38–126)
Anion gap: 11 (ref 5–15)
BUN: 12 mg/dL (ref 6–20)
CO2: 24 mmol/L (ref 22–32)
Calcium: 9.2 mg/dL (ref 8.9–10.3)
Chloride: 103 mmol/L (ref 98–111)
Creatinine, Ser: 0.98 mg/dL (ref 0.44–1.00)
GFR, Estimated: >60 mL/min (ref >60)
Glucose, Bld: 109 mg/dL — ABNORMAL HIGH (ref 70–99)
Potassium: 4 mmol/L (ref 3.5–5.1)
Sodium: 138 mmol/L (ref 135–145)
Total Bilirubin: <0.2 mg/dL (ref 0.0–1.2)
Total Protein: 7.4 g/dL (ref 6.5–8.1)

## 2024-09-18 LAB — GROUP A STREP BY PCR: Group A Strep by PCR: NOT DETECTED

## 2024-09-18 LAB — LIPASE, BLOOD: Lipase: 36 U/L (ref 11–51)

## 2024-09-18 MED ORDER — ONDANSETRON HCL 4 MG/2ML IJ SOLN
4.0000 mg | Freq: Once | INTRAMUSCULAR | Status: AC
  Administered 2024-09-18: 4 mg via INTRAVENOUS
  Filled 2024-09-18: qty 2

## 2024-09-18 MED ORDER — MORPHINE SULFATE (PF) 4 MG/ML IV SOLN
4.0000 mg | Freq: Once | INTRAVENOUS | Status: AC
  Administered 2024-09-18: 4 mg via INTRAVENOUS
  Filled 2024-09-18: qty 1

## 2024-09-18 MED ORDER — IOHEXOL 300 MG/ML  SOLN
100.0000 mL | Freq: Once | INTRAMUSCULAR | Status: AC | PRN
  Administered 2024-09-18: 100 mL via INTRAVENOUS

## 2024-09-18 MED ORDER — LACTATED RINGERS IV BOLUS
1000.0000 mL | Freq: Once | INTRAVENOUS | Status: AC
Start: 2024-09-18 — End: 2024-09-19
  Administered 2024-09-18: 1000 mL via INTRAVENOUS

## 2024-09-18 MED ORDER — DIPHENHYDRAMINE HCL 50 MG/ML IJ SOLN
50.0000 mg | Freq: Once | INTRAMUSCULAR | Status: AC
  Administered 2024-09-18: 50 mg via INTRAVENOUS
  Filled 2024-09-18: qty 1

## 2024-09-18 NOTE — ED Notes (Signed)
 Patient transported to CT

## 2024-09-18 NOTE — ED Provider Notes (Signed)
 [place] [facility] Provider Note   CSN: [id number] Arrival date & time: 09/18/24  2009     History Chief Complaint  Patient presents with   Abdominal Pain   Flank Pain    HPI Kelly Finley is a 43 y.o. female presenting for right sided flank pain. HX of GP D FCNVSSOB States that she has had longstanding constipation but does not typically have abdominal pain.  Substantial pain today very severe worsening into the evening. Patient's recorded medical, surgical, social, medication list and allergies were reviewed in the Snapshot window as part of the initial history.   Review of Systems   Review of Systems  Constitutional:  Negative for chills and fever.  HENT:  Negative for ear pain and sore throat.   Eyes:  Negative for pain and visual disturbance.  Respiratory:  Negative for cough and shortness of breath.   Cardiovascular:  Negative for chest pain and palpitations.  Gastrointestinal:  Positive for abdominal distention and abdominal pain. Negative for vomiting.  Genitourinary:  Negative for dysuria and hematuria.  Musculoskeletal:  Negative for arthralgias and back pain.  Skin:  Negative for color change and rash.  Neurological:  Negative for seizures and syncope.  All other systems reviewed and are negative.   Physical Exam Updated Vital Signs BP 120/79   Pulse 70   Temp 98 F (36.7 C) (Oral)   Resp 18   Ht 5' 9 (1.753 m)   Wt 90.7 kg   SpO2 99%   BMI 29.53 kg/m  Physical Exam Vitals and nursing note reviewed.  Constitutional:      General: She is not in acute distress.    Appearance: She is well-developed.  HENT:     Head: Normocephalic and atraumatic.  Eyes:     Conjunctiva/sclera: Conjunctivae normal.  Cardiovascular:     Rate and Rhythm: Normal rate and regular rhythm.     Heart sounds: No murmur heard. Pulmonary:     Effort: Pulmonary effort is normal. No respiratory distress.     Breath sounds: Normal  breath sounds.  Abdominal:     General: There is no distension.     Palpations: Abdomen is soft.     Tenderness: There is no abdominal tenderness. There is no right CVA tenderness or left CVA tenderness.  Musculoskeletal:        General: No swelling or tenderness. Normal range of motion.     Cervical back: Neck supple.  Skin:    General: Skin is warm and dry.  Neurological:     General: No focal deficit present.     Mental Status: She is alert and oriented to person, place, and time. Mental status is at baseline.     Cranial Nerves: No cranial nerve deficit.      ED Course/ Medical Decision Making/ A&P    Procedures Procedures   Medications Ordered in ED Medications  morphine (PF) 4 MG/ML injection 4 mg (4 mg Intravenous Given 09/18/24 2341)  ondansetron  (ZOFRAN ) injection 4 mg (4 mg Intravenous Given 09/18/24 2337)  lactated ringers bolus 1,000 mL (0 mLs Intravenous Stopped 09/19/24 0110)  diphenhydrAMINE  (BENADRYL ) injection 50 mg (50 mg Intravenous Given 09/18/24 2340)  iohexol  (OMNIPAQUE ) 300 MG/ML solution 100 mL (100 mLs Intravenous Contrast Given 09/18/24 2349)  metoCLOPramide (REGLAN) tablet 10 mg (10 mg Oral Given 09/19/24 0132)   Medical Decision Making:   Kelly Finley is a 43 y.o. female who presented to the ED today  with abdominal pain, detailed above.    Additional history discussed with patient's family/caregivers.  Patient placed on continuous vitals and telemetry monitoring while in ED which was reviewed periodically.  Complete initial physical exam performed, notably the patient  was HDS in NAD.     Reviewed and confirmed nursing documentation for past medical history, family history, social history.    Initial Assessment:   With the patient's presentation of abdominal pain, most likely diagnosis is nonspecific etiology. Other diagnoses were considered including (but not limited to) gastroenteritis, colitis, small bowel obstruction, appendicitis,  cholecystitis, pancreatitis, nephrolithiasis, UTI, pyleonephritis. These are considered less likely due to history of present illness and physical exam findings.   This is most consistent with an acute life/limb threatening illness complicated by underlying chronic conditions.   Initial Plan:  CBC/CMP to evaluate for underlying infectious/metabolic etiology for patient's abdominal pain  Lipase to evaluate for pancreatitis  EKG to evaluate for cardiac source of pain  CTAB/Pelvis with contrast to evaluate for structural/surgical etiology of patients' severe abdominal pain.  Urinalysis and repeat physical assessment to evaluate for UTI/Pyelonpehritis  Empiric management of symptoms with escalating pain control and antiemetics as needed.   Initial Study Results:   Laboratory  All laboratory results reviewed without evidence of clinically relevant pathology.    Radiology All images reviewed independently. Agree with radiology report at this time.   CT ABDOMEN PELVIS W CONTRAST Result Date: 09/19/2024 EXAM: CT ABDOMEN AND PELVIS WITH CONTRAST 09/19/2024 12:02:59 AM TECHNIQUE: CT of the abdomen and pelvis was performed with the administration of 100 mL of iohexol  (OMNIPAQUE ) 300 MG/ML solution. Multiplanar reformatted images are provided for review. Automated exposure control, iterative reconstruction, and/or weight-based adjustment of the mA/kV was utilized to reduce the radiation dose to as low as reasonably achievable. COMPARISON: 6 / 29 / 22. CLINICAL HISTORY: Abdominal pain, acute, nonlocalized. Right sided abdominal pain that wraps around to back. Hx gastroparesis and GERD. 100 mls Omni 300 IV. FINDINGS: LOWER CHEST: No acute abnormality. LIVER: The liver is unremarkable. GALLBLADDER AND BILE DUCTS: Gallbladder is unremarkable. No biliary ductal dilatation. SPLEEN: No acute abnormality. PANCREAS: No acute abnormality. ADRENAL GLANDS: No acute abnormality. KIDNEYS, URETERS AND BLADDER: Punctate  nonobstructing right nephrolithiasis. No stones in the left kidney or ureters. No hydronephrosis. No perinephric or periureteral stranding. Urinary bladder is unremarkable. GI AND BOWEL: Stomach demonstrates no acute abnormality. Large stool burden greatest in the right colon. There is no bowel obstruction. Normal appendix. PERITONEUM AND RETROPERITONEUM: No ascites. No free air. VASCULATURE: Aorta is normal in caliber. LYMPH NODES: No lymphadenopathy. REPRODUCTIVE ORGANS: No acute abnormality. BONES AND SOFT TISSUES: No acute osseous abnormality. No focal soft tissue abnormality. IMPRESSION: 1. Large stool burden greatest in the right colon. 2. Punctate nonobstructing right nephrolithiasis. Electronically signed by: Norman Gatlin MD 09/19/2024 12:08 AM EDT RP Workstation: [id number]   Final Reassessment and Plan:   Patient's history of present illness and physical exam findings are most consistent with worsening constipation as a likely etiology of her nocturnal abdominal pain. She is in no acute distress.  She is ambulatory tolerating p.o. intake after symptomatic management.  Medications prescribed, patient educated on MiraLAX cleanout treatment and supportive medication sent to pharmacy.  Strict return precautions regarding interval worsening reinforced and patient expressed understanding.  No evidence appendicitis, cholecystitis or obstructive uropathy at this time.     Clinical Impression:  1. Slow transit constipation   2. Abdominal pain, unspecified abdominal location      Discharge  Final Clinical Impression(s) / ED Diagnoses Final diagnoses:  Slow transit constipation  Abdominal pain, unspecified abdominal location    Rx / DC Orders ED Discharge Orders          Ordered    polyethylene glycol (MIRALAX) 17 g packet   Once,   Status:  Discontinued        09/19/24 0124    metoCLOPramide (REGLAN) 10 MG tablet  Every 6 hours,   Status:  Discontinued        09/19/24 0124     dicyclomine (BENTYL) 20 MG tablet  2 times daily,   Status:  Discontinued        09/19/24 0124    polyethylene glycol (MIRALAX) 17 g packet   Once        09/19/24 0125    metoCLOPramide (REGLAN) 10 MG tablet  Every 6 hours        09/19/24 0125    dicyclomine (BENTYL) 20 MG tablet  2 times daily        09/19/24 0125              Jerral Meth, MD 09/19/24 0157

## 2024-09-18 NOTE — ED Notes (Signed)
 ED Provider at bedside.

## 2024-09-18 NOTE — ED Triage Notes (Addendum)
 Patient reports right sided abdominal pain that wraps around the to back that started at approximately 1100 today that has worsened.  Sent from urgent care because they believe it may be her gall bladder. Patient reports history of gastroparesis.  Patient adds they have been having tonsil stones for the past 3-4 weeks that have been coming out.  Patients throat red upon visualization and right tonsil is inflamed.

## 2024-09-19 DIAGNOSIS — K5901 Slow transit constipation: Secondary | ICD-10-CM | POA: Diagnosis not present

## 2024-09-19 MED ORDER — DICYCLOMINE HCL 20 MG PO TABS: 20.0000 mg | ORAL_TABLET | Freq: Two times a day (BID) | ORAL | 0 refills | Status: DC

## 2024-09-19 MED ORDER — METOCLOPRAMIDE HCL 10 MG PO TABS: 10.0000 mg | ORAL_TABLET | Freq: Four times a day (QID) | ORAL | 0 refills | Status: AC

## 2024-09-19 MED ORDER — METOCLOPRAMIDE HCL 10 MG PO TABS: 10.0000 mg | ORAL_TABLET | Freq: Four times a day (QID) | ORAL | 0 refills | Status: DC

## 2024-09-19 MED ORDER — METOCLOPRAMIDE HCL 10 MG PO TABS
10.0000 mg | ORAL_TABLET | Freq: Once | ORAL | Status: AC
  Administered 2024-09-19: 10 mg via ORAL
  Filled 2024-09-19: qty 1

## 2024-09-19 MED ORDER — POLYETHYLENE GLYCOL 3350 17 G PO PACK: 17.0000 g | PACK | Freq: Once | ORAL | 0 refills | Status: DC

## 2024-09-19 MED ORDER — POLYETHYLENE GLYCOL 3350 17 G PO PACK: 17.0000 g | PACK | Freq: Once | ORAL | 0 refills | Status: AC

## 2024-09-19 MED ORDER — DICYCLOMINE HCL 20 MG PO TABS: 20.0000 mg | ORAL_TABLET | Freq: Two times a day (BID) | ORAL | 0 refills | Status: AC
